# Patient Record
Sex: Female | Born: 1960 | Race: White | Hispanic: No | Marital: Married | Smoking: Current some day smoker
Health system: Southern US, Community
[De-identification: ages and names within clinical notes are randomized; demographics above are authoritative.]

## PROBLEM LIST (undated history)

## (undated) DIAGNOSIS — J9811 Atelectasis: Secondary | ICD-10-CM

## (undated) DIAGNOSIS — J449 Chronic obstructive pulmonary disease, unspecified: Secondary | ICD-10-CM

## (undated) DIAGNOSIS — J45909 Unspecified asthma, uncomplicated: Secondary | ICD-10-CM

## (undated) HISTORY — PX: CHEST TUBE INSERTION: SHX231

## (undated) HISTORY — PX: RECTAL SURGERY: SHX760

---

## 2016-10-25 ENCOUNTER — Emergency Department
Admission: EM | Admit: 2016-10-25 | Discharge: 2016-10-25 | Disposition: A | Discharge status: Home / Self Care | Payer: Medicare Other | Attending: Emergency Medicine | Admitting: Emergency Medicine

## 2016-10-25 ENCOUNTER — Emergency Department: Payer: Medicare Other

## 2016-10-25 DIAGNOSIS — R109 Unspecified abdominal pain: Secondary | ICD-10-CM | POA: Insufficient documentation

## 2016-10-25 DIAGNOSIS — R0602 Shortness of breath: Secondary | ICD-10-CM | POA: Diagnosis not present

## 2016-10-25 DIAGNOSIS — Z79899 Other long term (current) drug therapy: Secondary | ICD-10-CM | POA: Diagnosis not present

## 2016-10-25 LAB — COMPREHENSIVE METABOLIC PANEL
ALBUMIN: 4 g/dL (ref 3.5–5.0)
ALK PHOS: 81 U/L (ref 38–126)
ALT: 12 U/L — ABNORMAL LOW (ref 14–54)
ANION GAP: 8 (ref 5–15)
AST: 19 U/L (ref 15–41)
BUN: 13 mg/dL (ref 6–20)
CALCIUM: 9.2 mg/dL (ref 8.9–10.3)
CHLORIDE: 102 mmol/L (ref 101–111)
CO2: 28 mmol/L (ref 22–32)
Creatinine, Ser: 0.45 mg/dL (ref 0.44–1.00)
GFR calc Af Amer: >60 mL/min (ref >60)
GFR calc non Af Amer: >60 mL/min (ref >60)
GLUCOSE: 118 mg/dL — AB (ref 65–99)
Potassium: 3.9 mmol/L (ref 3.5–5.1)
SODIUM: 138 mmol/L (ref 135–145)
Total Bilirubin: 0.7 mg/dL (ref 0.3–1.2)
Total Protein: 7.3 g/dL (ref 6.5–8.1)

## 2016-10-25 LAB — URINALYSIS, COMPLETE (UACMP) WITH MICROSCOPIC
BACTERIA UA: NONE SEEN
BILIRUBIN URINE: NEGATIVE
Glucose, UA: NEGATIVE mg/dL
KETONES UR: 20 mg/dL — AB
Nitrite: NEGATIVE
Protein, ur: 30 mg/dL — AB
Specific Gravity, Urine: 1.018 (ref 1.005–1.030)
pH: 5 (ref 5.0–8.0)

## 2016-10-25 LAB — CBC WITH DIFFERENTIAL/PLATELET
BASOS PCT: 0 %
Basophils Absolute: 0 10*3/uL (ref 0–0.1)
EOS ABS: 0.2 10*3/uL (ref 0–0.7)
EOS PCT: 2 %
HCT: 42 % (ref 35.0–47.0)
HEMOGLOBIN: 14.4 g/dL (ref 12.0–16.0)
Lymphocytes Relative: 6 %
Lymphs Abs: 0.6 10*3/uL — ABNORMAL LOW (ref 1.0–3.6)
MCH: 32.2 pg (ref 26.0–34.0)
MCHC: 34.2 g/dL (ref 32.0–36.0)
MCV: 94.2 fL (ref 80.0–100.0)
Monocytes Absolute: 0.6 10*3/uL (ref 0.2–0.9)
Monocytes Relative: 6 %
NEUTROS PCT: 86 %
Neutro Abs: 9.8 10*3/uL — ABNORMAL HIGH (ref 1.4–6.5)
PLATELETS: 292 10*3/uL (ref 150–440)
RBC: 4.46 MIL/uL (ref 3.80–5.20)
RDW: 13.3 % (ref 11.5–14.5)
WBC: 11.2 10*3/uL — AB (ref 3.6–11.0)

## 2016-10-25 LAB — TROPONIN I

## 2016-10-25 LAB — LIPASE, BLOOD: Lipase: 12 U/L (ref 11–51)

## 2016-10-25 MED ORDER — DICLOFENAC SODIUM 3 % TD GEL: 1.0000 "application " | Freq: Two times a day (BID) | TRANSDERMAL | 0 refills | Status: DC | PRN

## 2016-10-25 MED ORDER — MORPHINE SULFATE (PF) 4 MG/ML IV SOLN
4.0000 mg | Freq: Once | INTRAVENOUS | Status: AC
  Administered 2016-10-25: 4 mg via INTRAVENOUS
  Filled 2016-10-25: qty 1

## 2016-10-25 MED ORDER — FLUCONAZOLE 100 MG PO TABS
150.0000 mg | ORAL_TABLET | Freq: Once | ORAL | Status: AC
  Administered 2016-10-25: 150 mg via ORAL
  Filled 2016-10-25: qty 1

## 2016-10-25 MED ORDER — NITROFURANTOIN MONOHYD MACRO 100 MG PO CAPS: 100.0000 mg | ORAL_CAPSULE | Freq: Two times a day (BID) | ORAL | 0 refills | Status: AC

## 2016-10-25 MED ORDER — ONDANSETRON HCL 4 MG/2ML IJ SOLN
4.0000 mg | Freq: Once | INTRAMUSCULAR | Status: AC
  Administered 2016-10-25: 4 mg via INTRAVENOUS
  Filled 2016-10-25: qty 2

## 2016-10-25 NOTE — ED Provider Notes (Signed)
[facility] Emergency Department Provider Note   ____________________________________________   First MD Initiated Contact with Patient 10/25/16 [phone]     (approximate)  I have reviewed the triage vital signs and the nursing notes.   HISTORY  Chief Complaint Back Pain and Shortness of Breath   HPI Renee Cochran is a 55 y.o. female with a history of COPD on 2-1/2 L of baseline oxygen who is presenting with right-sided flank and back pain that is been ongoing for the past day. The patient denies any injury or lifting or bending that started the pain. Says the pain is sharp and worsens with movement. Says that she also feels short of breath but feels that this is from the pain. Says that she did have a similar presentation previously that was diagnosed as pneumonia. Denies any burning with urination or blood in the urine. No history of kidney stones.   No past medical history on file.  There are no active problems to display for this patient.   No past surgical history on file.  Prior to Admission medications   Medication Sig Start Date End Date Taking? Authorizing Provider  Diclofenac Sodium 3 % GEL Place 1 application onto the skin every 12 (twelve) hours as needed (pain). 10/25/16   Myrna Blazer, MD    Allergies Penicillins and Sulfa antibiotics  No family history on file.  Social History Social History  Substance Use Topics  . Smoking status: Not on file  . Smokeless tobacco: Not on file  . Alcohol use Not on file    Review of Systems Constitutional: No fever/chills Eyes: No visual changes. ENT: No sore throat. Cardiovascular: Denies chest pain. Respiratory: As above. Gastrointestinal: No abdominal pain.  No nausea, no vomiting.  No diarrhea.  No constipation. Genitourinary: Negative for dysuria. Musculoskeletal: As above Skin: Negative for rash. Neurological: Negative for headaches, focal weakness or numbness.  10-point  ROS otherwise negative.  ____________________________________________   PHYSICAL EXAM:  VITAL SIGNS: ED Triage Vitals  Enc Vitals Group     BP 10/25/16 0526 (!) 150/67     Pulse Rate 10/25/16 0515 (!) 111     Resp 10/25/16 0526 (!) 22     Temp 10/25/16 0523 98.5 F (36.9 C)     Temp Source 10/25/16 0523 Oral     SpO2 10/25/16 0515 93 %     Weight 10/25/16 0528 140 lb (63.5 kg)     Height 10/25/16 0528 5\' 3"  (1.6 m)     Head Circumference --      Peak Flow --      Pain Score 10/25/16 0526 4     Pain Loc --      Pain Edu? --      Excl. in GC? --     Constitutional: Alert and oriented. Well appearing and in no acute distress. Eyes: Conjunctivae are normal. PERRL. EOMI. Head: Atraumatic. Nose: No congestion/rhinnorhea. Mouth/Throat: Mucous membranes are moist.   Neck: No stridor.   Cardiovascular: Tachycardic, regular rhythm. Grossly normal heart sounds.  Good peripheral circulation. Respiratory: Tachypneic but without respiratory distress.  No retractions. Lungs CTAB. Gastrointestinal: Soft and nontender. No distention. No CVA tenderness. Musculoskeletal: No lower extremity tenderness nor edema.  No joint effusions. Neurologic:  Normal speech and language. No gross focal neurologic deficits are appreciated.  Skin:  Skin is warm, dry and intact. No rash noted. Psychiatric: Mood and affect are normal. Speech and behavior are normal.  ____________________________________________   LABS (all  labs ordered are listed, but only abnormal results are displayed)  Labs Reviewed  CBC WITH DIFFERENTIAL/PLATELET - Abnormal; Notable for the following:       Result Value   WBC 11.2 (*)    Neutro Abs 9.8 (*)    Lymphs Abs 0.6 (*)    All other components within normal limits  COMPREHENSIVE METABOLIC PANEL - Abnormal; Notable for the following:    Glucose, Bld 118 (*)    ALT 12 (*)    All other components within normal limits  LIPASE, BLOOD  TROPONIN I  URINALYSIS, COMPLETE  (UACMP) WITH MICROSCOPIC   ____________________________________________  EKG  ED ECG REPORT I, Arelia LongestSchaevitz,  Arelyn Gauer M, the attending physician, personally viewed and interpreted this ECG.   Date: 10/25/2016  EKG Time: 0534  Rate: 114  Rhythm: sinus tachycardia  Axis: Normal  Intervals:none  ST&T Change: No ST segment elevation or depression. No abnormal T-wave inversion. EKG machine reads minimal ST depression. Likely related to baseline wander.  ____________________________________________  RADIOLOGY  CT RENAL STONE STUDY (Final result)  Result time 10/25/16 06:36:45  Final result by Elgie CollardArash Radparvar, MD (10/25/16 06:36:45)           Narrative:   CLINICAL DATA: 55 year old female with right flank pain.  EXAM: CT ABDOMEN AND PELVIS WITHOUT CONTRAST  TECHNIQUE: Multidetector CT imaging of the abdomen and pelvis was performed following the standard protocol without IV contrast.  COMPARISON: None.  FINDINGS: Evaluation of this exam is limited in the absence of intravenous contrast as well as due to respiratory motion artifact.  Lower chest: Mild bibasilar bronchiectatic changes. The visualized lung bases are otherwise clear.  No intra-abdominal free air or free fluid.  Hepatobiliary: The liver is unremarkable. No intrahepatic biliary ductal dilatation. A 2 mm calcific focus (Series 2, image 19 and coronal image 60) may represent focal calcification of the hepatic capsule versus a small gallstone. No pericholecystic fluid.  Pancreas: Unremarkable. No pancreatic ductal dilatation or surrounding inflammatory changes.  Spleen: Normal in size without focal abnormality.  Adrenals/Urinary Tract: Adrenal glands are unremarkable. Kidneys are normal, without renal calculi, focal lesion, or hydronephrosis. Bladder is unremarkable.  Stomach/Bowel: There is moderate stool throughout the colon. There is no bowel obstruction or active inflammation. Normal  appendix.  Vascular/Lymphatic: Moderate aortoiliac atherosclerotic disease. The abdominal aorta and IVC are grossly unremarkable on this noncontrast study. No portal venous gas identified. There is no adenopathy.  Reproductive: The uterus and ovaries are grossly unremarkable.  Other: There is diastases of anterior abdominal wall musculature in the midline with a small fat containing umbilical hernia. No fluid collection.  Musculoskeletal: Osteopenia with mild degenerative changes of the spine. No acute fracture.  IMPRESSION: No hydronephrosis or nephrolithiasis.  No bowel obstruction or active inflammation. Normal appendix.  Probable small gallstone. No pericholecystic fluid or evidence of acute cholecystitis.   Electronically Signed By: Elgie CollardArash Radparvar M.D. On: 10/25/2016 06:36            DG Chest 1 View (Final result)  Result time 10/25/16 06:28:35  Final result by Awilda Metroourtnay Bloomer, MD (10/25/16 [id number])           Narrative:   CLINICAL DATA: RIGHT flank pain. History of COPD.  EXAM: CHEST 1 VIEW  COMPARISON: None available for comparison at time of study interpretation.  FINDINGS: Cardiomediastinal silhouette is normal. Bandlike density RIGHT lung base, strandy densities LEFT lung base. Increased lung volumes. No pleural effusion. No focal consolidation. No pneumothorax. Old RIGHT rib fractures. Osteopenia.Mid thoracic  suspected compression fracture.  IMPRESSION: COPD, RIGHT greater than LEFT atelectasis/scarring.   Electronically Signed By: Awilda Metroourtnay Bloomer M.D. On: 10/25/2016 06:28            ____________________________________________   PROCEDURES  Procedure(s) performed:   Procedures  Critical Care performed:   ____________________________________________   INITIAL IMPRESSION / ASSESSMENT AND PLAN / ED COURSE  Pertinent labs & imaging results that were available during my care of the patient were reviewed by  me and considered in my medical decision making (see chart for details).   Clinical Course   ----------------------------------------- 7:40 AM on 10/25/2016 -----------------------------------------  Patient's pain is improved with morphine. Heart rate down to 102. Pending urine at this time. Signed out to Dr. Lenard LancePaduchowski. Likely musculoskeletal pain.    ____________________________________________   FINAL CLINICAL IMPRESSION(S) / ED DIAGNOSES  Final diagnoses:  Right flank pain      NEW MEDICATIONS STARTED DURING THIS VISIT:  New Prescriptions   DICLOFENAC SODIUM 3 % GEL    Place 1 application onto the skin every 12 (twelve) hours as needed (pain).     Note:  This document was prepared using Dragon voice recognition software and may include unintentional dictation errors.    Myrna Blazeravid Matthew Maze Corniel, MD 10/25/16 [phone]

## 2016-10-25 NOTE — ED Notes (Signed)
Pt resting. Reports breathing WNL. Pt encouraged to provide urine sample, water given x 2. Pt understands lack of sample is holding up possible discharge.

## 2016-10-25 NOTE — Discharge Instructions (Addendum)
Your urine has shown a mild urinary tract infection, possible yeast infection. Give been covered for both of these possibilities. A urine culture has been sent. Please follow-up with your primary care doctor in the next 2-3 days for recheck/reevaluation. Return to the emergency department for any worsening pain, fever, or any other symptom personally concerning to yourself.

## 2016-10-25 NOTE — ED Triage Notes (Signed)
Pt c/o mid back pain radiating around ribs x 1.5 days. Pt has COPD and wears O2 @ 2.5/L/min/[place] @ home 24/7. Pt c/o increased shortness of breath. Pt is tachycardiac w/ loud, prolonged expiration. Pt states pain reproducible w/ movement and coughing.

## 2016-10-26 LAB — URINE CULTURE: Culture: >=100000 — AB

## 2016-10-27 NOTE — Progress Notes (Signed)
ED Antimicrobial Stewardship Positive Culture Follow Up   Kennith CenterBonnie Stormes is an 55 y.o. female who presented [facility]2016 with a chief complaint of  Chief Complaint  Patient presents with  . Back Pain  . Shortness of Breath   Recent Results (from the past 720 hour(s))  Urine culture     Status: Abnormal   Collection Time: 10/25/16 11:15 AM  Result Value Ref Range Status   Specimen Description URINE, RANDOM  Final   Special Requests NONE  Final   Culture >=100,000 COLONIES/mL YEAST (A)  Final   Report Status 10/26/2016 FINAL  Final    [x]  Treated with nitrofurantoin, organism resistant to prescribed antimicrobial  Called and spoke to patient regarding urine culture results and [facility] [address].  New antibiotic prescription: fluconazole 150 mg tablet x 1   ED Provider: Dr. Margarette AsalSchaevitz   Samariah Hokenson M Jamis Kryder, PharmD Clinical Pharmacist 10/27/2016, 2:07 PM

## 2017-05-03 ENCOUNTER — Inpatient Hospital Stay
Admission: EM | Admit: 2017-05-03 | Discharge: 2017-05-06 | DRG: 199 | Disposition: A | Discharge status: Home / Self Care | Payer: Medicare Other | Attending: Internal Medicine | Admitting: Internal Medicine

## 2017-05-03 ENCOUNTER — Emergency Department: Payer: Medicare Other

## 2017-05-03 DIAGNOSIS — Z882 Allergy status to sulfonamides status: Secondary | ICD-10-CM | POA: Diagnosis not present

## 2017-05-03 DIAGNOSIS — Z79899 Other long term (current) drug therapy: Secondary | ICD-10-CM

## 2017-05-03 DIAGNOSIS — J9811 Atelectasis: Secondary | ICD-10-CM | POA: Diagnosis present

## 2017-05-03 DIAGNOSIS — J9601 Acute respiratory failure with hypoxia: Secondary | ICD-10-CM | POA: Diagnosis not present

## 2017-05-03 DIAGNOSIS — J441 Chronic obstructive pulmonary disease with (acute) exacerbation: Secondary | ICD-10-CM | POA: Diagnosis not present

## 2017-05-03 DIAGNOSIS — J9621 Acute and chronic respiratory failure with hypoxia: Secondary | ICD-10-CM | POA: Diagnosis present

## 2017-05-03 DIAGNOSIS — Z9981 Dependence on supplemental oxygen: Secondary | ICD-10-CM

## 2017-05-03 DIAGNOSIS — J9312 Secondary spontaneous pneumothorax: Secondary | ICD-10-CM | POA: Diagnosis not present

## 2017-05-03 DIAGNOSIS — J9383 Other pneumothorax: Secondary | ICD-10-CM

## 2017-05-03 DIAGNOSIS — Z880 Allergy status to penicillin: Secondary | ICD-10-CM | POA: Diagnosis not present

## 2017-05-03 DIAGNOSIS — J440 Chronic obstructive pulmonary disease with acute lower respiratory infection: Secondary | ICD-10-CM | POA: Diagnosis present

## 2017-05-03 DIAGNOSIS — J9690 Respiratory failure, unspecified, unspecified whether with hypoxia or hypercapnia: Secondary | ICD-10-CM | POA: Diagnosis present

## 2017-05-03 DIAGNOSIS — J209 Acute bronchitis, unspecified: Secondary | ICD-10-CM | POA: Diagnosis present

## 2017-05-03 DIAGNOSIS — J930 Spontaneous tension pneumothorax: Secondary | ICD-10-CM | POA: Diagnosis present

## 2017-05-03 DIAGNOSIS — Z7951 Long term (current) use of inhaled steroids: Secondary | ICD-10-CM | POA: Diagnosis not present

## 2017-05-03 DIAGNOSIS — J939 Pneumothorax, unspecified: Secondary | ICD-10-CM | POA: Diagnosis present

## 2017-05-03 DIAGNOSIS — R0602 Shortness of breath: Secondary | ICD-10-CM | POA: Diagnosis present

## 2017-05-03 DIAGNOSIS — F17210 Nicotine dependence, cigarettes, uncomplicated: Secondary | ICD-10-CM | POA: Diagnosis present

## 2017-05-03 HISTORY — DX: Chronic obstructive pulmonary disease, unspecified: J44.9

## 2017-05-03 HISTORY — DX: Unspecified asthma, uncomplicated: J45.909

## 2017-05-03 LAB — BASIC METABOLIC PANEL
ANION GAP: 8 (ref 5–15)
Anion gap: 8 (ref 5–15)
BUN: 14 mg/dL (ref 6–20)
BUN: 15 mg/dL (ref 6–20)
CALCIUM: 8.7 mg/dL — AB (ref 8.9–10.3)
CO2: 31 mmol/L (ref 22–32)
CO2: 32 mmol/L (ref 22–32)
Calcium: 9 mg/dL (ref 8.9–10.3)
Chloride: 100 mmol/L — ABNORMAL LOW (ref 101–111)
Chloride: 100 mmol/L — ABNORMAL LOW (ref 101–111)
Creatinine, Ser: 0.67 mg/dL (ref 0.44–1.00)
Creatinine, Ser: 0.65 mg/dL (ref 0.44–1.00)
GFR calc Af Amer: >60 mL/min (ref >60)
GLUCOSE: 191 mg/dL — AB (ref 65–99)
GLUCOSE: 207 mg/dL — AB (ref 65–99)
POTASSIUM: 3.6 mmol/L (ref 3.5–5.1)
POTASSIUM: 3.8 mmol/L (ref 3.5–5.1)
SODIUM: 139 mmol/L (ref 135–145)
Sodium: 140 mmol/L (ref 135–145)

## 2017-05-03 LAB — CBC
HCT: 41.5 % (ref 35.0–47.0)
Hemoglobin: 13.7 g/dL (ref 12.0–16.0)
MCH: 32.1 pg (ref 26.0–34.0)
MCHC: 33 g/dL (ref 32.0–36.0)
MCV: 97.5 fL (ref 80.0–100.0)
PLATELETS: 282 10*3/uL (ref 150–440)
RBC: 4.26 MIL/uL (ref 3.80–5.20)
RDW: 14.3 % (ref 11.5–14.5)
WBC: 14.8 10*3/uL — AB (ref 3.6–11.0)

## 2017-05-03 LAB — PHOSPHORUS: PHOSPHORUS: 3.2 mg/dL (ref 2.5–4.6)

## 2017-05-03 LAB — GLUCOSE, CAPILLARY
GLUCOSE-CAPILLARY: 195 mg/dL — AB (ref 65–99)
GLUCOSE-CAPILLARY: 178 mg/dL — AB (ref 65–99)
GLUCOSE-CAPILLARY: 172 mg/dL — AB (ref 65–99)
GLUCOSE-CAPILLARY: 152 mg/dL — AB (ref 65–99)
GLUCOSE-CAPILLARY: 175 mg/dL — AB (ref 65–99)

## 2017-05-03 LAB — TROPONIN I
Troponin I: <0.03 ng/mL (ref <0.03)
Troponin I: <0.03 ng/mL (ref <0.03)

## 2017-05-03 LAB — CBC WITH DIFFERENTIAL/PLATELET
Basophils Absolute: 0 10*3/uL (ref 0–0.1)
Basophils Relative: 0 %
EOS PCT: 1 %
Eosinophils Absolute: 0.2 10*3/uL (ref 0–0.7)
HEMATOCRIT: 44 % (ref 35.0–47.0)
Hemoglobin: 14.5 g/dL (ref 12.0–16.0)
LYMPHS ABS: 1.9 10*3/uL (ref 1.0–3.6)
LYMPHS PCT: 9 %
MCH: 31.3 pg (ref 26.0–34.0)
MCHC: 32.9 g/dL (ref 32.0–36.0)
MCV: 95 fL (ref 80.0–100.0)
MONO ABS: 1.1 10*3/uL — AB (ref 0.2–0.9)
Monocytes Relative: 5 %
Neutro Abs: 18.4 10*3/uL — ABNORMAL HIGH (ref 1.4–6.5)
Neutrophils Relative %: 85 %
PLATELETS: 404 10*3/uL (ref 150–440)
RBC: 4.63 MIL/uL (ref 3.80–5.20)
RDW: 14.3 % (ref 11.5–14.5)
WBC: 21.7 10*3/uL — ABNORMAL HIGH (ref 3.6–11.0)

## 2017-05-03 LAB — PROCALCITONIN

## 2017-05-03 LAB — MRSA PCR SCREENING: MRSA by PCR: NEGATIVE

## 2017-05-03 LAB — MAGNESIUM: MAGNESIUM: 2.9 mg/dL — AB (ref 1.7–2.4)

## 2017-05-03 MED ORDER — MOMETASONE FURO-FORMOTEROL FUM 200-5 MCG/ACT IN AERO: 2.0000 | INHALATION_SPRAY | Freq: Two times a day (BID) | RESPIRATORY_TRACT | Status: DC

## 2017-05-03 MED ORDER — IPRATROPIUM-ALBUTEROL 0.5-2.5 (3) MG/3ML IN SOLN
3.0000 mL | RESPIRATORY_TRACT | Status: AC
  Administered 2017-05-03: 3 mL via RESPIRATORY_TRACT

## 2017-05-03 MED ORDER — GABAPENTIN 600 MG PO TABS
300.0000 mg | ORAL_TABLET | Freq: Three times a day (TID) | ORAL | Status: DC
  Filled 2017-05-03: qty 0.5

## 2017-05-03 MED ORDER — MAGNESIUM SULFATE 2 GM/50ML IV SOLN
2.0000 g | Freq: Once | INTRAVENOUS | Status: AC
  Administered 2017-05-03: 2 g via INTRAVENOUS
  Filled 2017-05-03: qty 50

## 2017-05-03 MED ORDER — BUDESONIDE 0.25 MG/2ML IN SUSP
0.2500 mg | Freq: Two times a day (BID) | RESPIRATORY_TRACT | Status: DC
  Administered 2017-05-03 – 2017-05-04 (×3): 0.25 mg via RESPIRATORY_TRACT
  Filled 2017-05-03 (×3): qty 2

## 2017-05-03 MED ORDER — ORAL CARE MOUTH RINSE
15.0000 mL | Freq: Two times a day (BID) | OROMUCOSAL | Status: DC
  Administered 2017-05-03 – 2017-05-06 (×7): 15 mL via OROMUCOSAL

## 2017-05-03 MED ORDER — SODIUM CHLORIDE 0.9% FLUSH: 3.0000 mL | INTRAVENOUS | Status: DC | PRN

## 2017-05-03 MED ORDER — ALBUTEROL SULFATE (2.5 MG/3ML) 0.083% IN NEBU
7.5000 mg | INHALATION_SOLUTION | Freq: Once | RESPIRATORY_TRACT | Status: AC
  Administered 2017-05-03: 7.5 mg via RESPIRATORY_TRACT
  Filled 2017-05-03: qty 9

## 2017-05-03 MED ORDER — LIDOCAINE HCL (PF) 1 % IJ SOLN
INTRAMUSCULAR | Status: AC
  Filled 2017-05-03: qty 10

## 2017-05-03 MED ORDER — MORPHINE SULFATE (PF) 4 MG/ML IV SOLN
INTRAVENOUS | Status: AC
  Filled 2017-05-03: qty 1

## 2017-05-03 MED ORDER — INSULIN ASPART 100 UNIT/ML ~~LOC~~ SOLN
0.0000 [IU] | Freq: Three times a day (TID) | SUBCUTANEOUS | Status: DC
  Administered 2017-05-03 – 2017-05-04 (×4): 3 [IU] via SUBCUTANEOUS
  Administered 2017-05-04: 2 [IU] via SUBCUTANEOUS
  Administered 2017-05-05: 5 [IU] via SUBCUTANEOUS
  Filled 2017-05-03 (×6): qty 1

## 2017-05-03 MED ORDER — SODIUM CHLORIDE 0.9% FLUSH
3.0000 mL | Freq: Two times a day (BID) | INTRAVENOUS | Status: DC
  Administered 2017-05-03 – 2017-05-06 (×8): 3 mL via INTRAVENOUS

## 2017-05-03 MED ORDER — MORPHINE SULFATE (PF) 2 MG/ML IV SOLN: 2.0000 mg | INTRAVENOUS | Status: DC | PRN

## 2017-05-03 MED ORDER — MORPHINE SULFATE (PF) 4 MG/ML IV SOLN
4.0000 mg | Freq: Once | INTRAVENOUS | Status: AC
  Administered 2017-05-03: 01:00:00 via INTRAVENOUS

## 2017-05-03 MED ORDER — ONDANSETRON HCL 4 MG/2ML IJ SOLN: 4.0000 mg | Freq: Four times a day (QID) | INTRAMUSCULAR | Status: DC | PRN

## 2017-05-03 MED ORDER — LORAZEPAM 2 MG/ML IJ SOLN
0.5000 mg | Freq: Once | INTRAMUSCULAR | Status: AC
  Administered 2017-05-03: 0.5 mg via INTRAVENOUS
  Filled 2017-05-03: qty 1

## 2017-05-03 MED ORDER — ALBUTEROL SULFATE (2.5 MG/3ML) 0.083% IN NEBU: 2.5000 mg | INHALATION_SOLUTION | RESPIRATORY_TRACT | Status: DC

## 2017-05-03 MED ORDER — ALBUTEROL SULFATE (2.5 MG/3ML) 0.083% IN NEBU: 2.5000 mg | INHALATION_SOLUTION | RESPIRATORY_TRACT | Status: DC | PRN

## 2017-05-03 MED ORDER — IPRATROPIUM BROMIDE 0.02 % IN SOLN
1.0000 ug | Freq: Three times a day (TID) | RESPIRATORY_TRACT | Status: DC
Start: 2017-05-03 — End: 2017-05-03

## 2017-05-03 MED ORDER — MONTELUKAST SODIUM 10 MG PO TABS
10.0000 mg | ORAL_TABLET | Freq: Every day | ORAL | Status: DC
  Administered 2017-05-03 – 2017-05-05 (×3): 10 mg via ORAL
  Filled 2017-05-03 (×3): qty 1

## 2017-05-03 MED ORDER — INSULIN ASPART 100 UNIT/ML ~~LOC~~ SOLN: 0.0000 [IU] | Freq: Every day | SUBCUTANEOUS | Status: DC

## 2017-05-03 MED ORDER — ENOXAPARIN SODIUM 40 MG/0.4ML ~~LOC~~ SOLN
40.0000 mg | SUBCUTANEOUS | Status: DC
  Administered 2017-05-03 – 2017-05-06 (×4): 40 mg via SUBCUTANEOUS
  Filled 2017-05-03 (×4): qty 0.4

## 2017-05-03 MED ORDER — SODIUM CHLORIDE 0.9 % IV SOLN: 250.0000 mL | INTRAVENOUS | Status: DC | PRN

## 2017-05-03 MED ORDER — ACETAMINOPHEN 325 MG PO TABS
650.0000 mg | ORAL_TABLET | Freq: Four times a day (QID) | ORAL | Status: AC
  Administered 2017-05-03 – 2017-05-06 (×11): 650 mg via ORAL
  Filled 2017-05-03 (×11): qty 2

## 2017-05-03 MED ORDER — IPRATROPIUM-ALBUTEROL 0.5-2.5 (3) MG/3ML IN SOLN
3.0000 mL | RESPIRATORY_TRACT | Status: DC
  Administered 2017-05-03 – 2017-05-04 (×8): 3 mL via RESPIRATORY_TRACT
  Filled 2017-05-03 (×8): qty 3

## 2017-05-03 MED ORDER — DEXTROSE 5 % IV SOLN
500.0000 mg | INTRAVENOUS | Status: DC
  Filled 2017-05-03: qty 500

## 2017-05-03 MED ORDER — METHYLPREDNISOLONE SODIUM SUCC 40 MG IJ SOLR
40.0000 mg | Freq: Every day | INTRAMUSCULAR | Status: DC
  Administered 2017-05-04 – 2017-05-06 (×3): 40 mg via INTRAVENOUS
  Filled 2017-05-03 (×3): qty 1

## 2017-05-03 MED ORDER — ALBUTEROL SULFATE HFA 108 (90 BASE) MCG/ACT IN AERS: 2.0000 | INHALATION_SPRAY | RESPIRATORY_TRACT | Status: DC | PRN

## 2017-05-03 MED ORDER — ENOXAPARIN SODIUM 40 MG/0.4ML ~~LOC~~ SOLN: 40.0000 mg | SUBCUTANEOUS | Status: DC

## 2017-05-03 MED ORDER — TIOTROPIUM BROMIDE MONOHYDRATE 18 MCG IN CAPS: 18.0000 ug | ORAL_CAPSULE | Freq: Every day | RESPIRATORY_TRACT | Status: DC

## 2017-05-03 MED ORDER — ACETAMINOPHEN 650 MG RE SUPP: 650.0000 mg | Freq: Four times a day (QID) | RECTAL | Status: DC | PRN

## 2017-05-03 MED ORDER — ONDANSETRON HCL 4 MG PO TABS: 4.0000 mg | ORAL_TABLET | Freq: Four times a day (QID) | ORAL | Status: DC | PRN

## 2017-05-03 MED ORDER — DICLOFENAC SODIUM 3 % TD GEL: 1.0000 "application " | Freq: Two times a day (BID) | TRANSDERMAL | Status: DC | PRN

## 2017-05-03 MED ORDER — DEXTROSE 5 % IV SOLN
500.0000 mg | Freq: Once | INTRAVENOUS | Status: AC
  Administered 2017-05-03: 500 mg via INTRAVENOUS
  Filled 2017-05-03: qty 500

## 2017-05-03 MED ORDER — KCL IN DEXTROSE-NACL 20-5-0.45 MEQ/L-%-% IV SOLN
INTRAVENOUS | Status: DC
  Administered 2017-05-03: 05:00:00 via INTRAVENOUS
  Filled 2017-05-03 (×3): qty 1000

## 2017-05-03 MED ORDER — ASPIRIN 81 MG PO CHEW
324.0000 mg | CHEWABLE_TABLET | ORAL | Status: AC
  Administered 2017-05-03: 324 mg via ORAL
  Filled 2017-05-03: qty 4

## 2017-05-03 MED ORDER — IPRATROPIUM-ALBUTEROL 0.5-2.5 (3) MG/3ML IN SOLN
RESPIRATORY_TRACT | Status: AC
  Administered 2017-05-03: 3 mL via RESPIRATORY_TRACT
  Filled 2017-05-03: qty 3

## 2017-05-03 MED ORDER — ASPIRIN 300 MG RE SUPP: 300.0000 mg | RECTAL | Status: AC

## 2017-05-03 MED ORDER — AZITHROMYCIN 250 MG PO TABS
500.0000 mg | ORAL_TABLET | Freq: Every day | ORAL | Status: DC
  Administered 2017-05-04 – 2017-05-06 (×3): 500 mg via ORAL
  Filled 2017-05-03: qty 2
  Filled 2017-05-03: qty 1
  Filled 2017-05-03: qty 2

## 2017-05-03 MED ORDER — LIDOCAINE HCL (PF) 1 % IJ SOLN: 20.0000 mL | Freq: Once | INTRAMUSCULAR | Status: DC

## 2017-05-03 MED ORDER — ACETAMINOPHEN 325 MG PO TABS
650.0000 mg | ORAL_TABLET | Freq: Four times a day (QID) | ORAL | Status: DC | PRN
  Administered 2017-05-03: 650 mg via ORAL
  Filled 2017-05-03: qty 2

## 2017-05-03 MED ORDER — DICLOFENAC SODIUM 1 % TD GEL
2.0000 g | Freq: Two times a day (BID) | TRANSDERMAL | Status: DC | PRN
  Filled 2017-05-03: qty 100

## 2017-05-03 MED ORDER — SENNOSIDES-DOCUSATE SODIUM 8.6-50 MG PO TABS: 1.0000 | ORAL_TABLET | Freq: Every evening | ORAL | Status: DC | PRN

## 2017-05-03 MED ORDER — GABAPENTIN 300 MG PO CAPS
300.0000 mg | ORAL_CAPSULE | Freq: Three times a day (TID) | ORAL | Status: DC
  Administered 2017-05-03 – 2017-05-06 (×9): 300 mg via ORAL
  Filled 2017-05-03 (×9): qty 1

## 2017-05-03 MED ORDER — MORPHINE SULFATE (PF) 2 MG/ML IV SOLN
2.0000 mg | INTRAVENOUS | Status: DC | PRN
Start: 2017-05-03 — End: 2017-05-06
  Administered 2017-05-03 – 2017-05-05 (×6): 2 mg via INTRAVENOUS
  Filled 2017-05-03 (×6): qty 1

## 2017-05-03 MED ORDER — METHYLPREDNISOLONE SODIUM SUCC 40 MG IJ SOLR
40.0000 mg | Freq: Two times a day (BID) | INTRAMUSCULAR | Status: DC
  Administered 2017-05-03: 40 mg via INTRAVENOUS
  Filled 2017-05-03: qty 1

## 2017-05-03 NOTE — Consult Note (Signed)
Name: Renee Cochran MRN: [id number] DOB: May 04, 1961    ADMISSION DATE:  05/03/2017 CONSULTATION DATE: 05/03/2017  REFERRING MD : Dr. Tonita CongWoodham     CHIEF COMPLAINT: Shortness of Breath   BRIEF PATIENT DESCRIPTION:  56 yo female admitted 06/26 with acute on chronic hypoxic respiratory failure secondary to AECOPD was placed on continuous Bipap and developed a moderate left sided pneumothorax requiring chest tube placement   SIGNIFICANT EVENTS  06/26-Pt admitted to ICU   STUDIES:  None   HISTORY OF PRESENT ILLNESS:   This is a 56 yo female with a PMH of COPD, Chronic O2 at 2-3L, Current Everyday Smoker, and Asthma. [facility] 06/26 with c/o shortness of breath and productive cough onset of symptoms 2 weeks prior to presentation to the ER.  Per ER notes she also reported bilateral sharp thoracic pain worse during coughing.  Due to symptoms she notified EMS and upon EMS arrival pts O2 sats were in the low 80's on her home O2.  Therefore, she was given 2 albuterol nebs and 125 mg iv solumedrol en route to the ER.  In the ER she was given albuterol neb, iv morphine, iv magnesium, and placed on continuous Bipap. However, she remained hypoxic CXR revealed a moderate left sided pneumothorax requiring left sided chest tube placement by surgical team.  She was subsequently admitted by the  surgical team to ICU for further workup and treatment PCCM consulted.    PAST MEDICAL HISTORY :   has a past medical history of Asthma and COPD (chronic obstructive pulmonary disease) (HCC).  has no past surgical history on file. Prior to Admission medications   Medication Sig Start Date End Date Taking? Authorizing Provider  albuterol (PROVENTIL HFA;VENTOLIN HFA) 108 (90 Base) MCG/ACT inhaler Inhale 2 puffs into the lungs every 4 (four) hours as needed. 04/16/16   [provider]  albuterol (PROVENTIL) (2.5 MG/3ML) 0.083% nebulizer solution Take 2.5 mg by nebulization every 4 (four) hours as  needed for wheezing or shortness of breath.    [provider]  Diclofenac Sodium 3 % GEL Place 1 application onto the skin every 12 (twelve) hours as needed (pain). 10/25/16   Myrna BlazerSchaevitz, David Matthew, MD  Fluticasone-Salmeterol (ADVAIR DISKUS) 250-50 MCG/DOSE AEPB Inhale 1 puff into the lungs 2 (two) times daily. 04/16/16   [provider]  ipratropium (ATROVENT) 0.02 % nebulizer solution Take 1 mcg by nebulization 3 (three) times daily.  10/14/16   [provider]  montelukast (SINGULAIR) 10 MG tablet Take 1 tablet by mouth at bedtime. 09/27/16   [provider]  tiotropium (SPIRIVA) 18 MCG inhalation capsule Place 18 mcg into inhaler and inhale daily.    [provider]   Allergies  Allergen Reactions  . Penicillins Anaphylaxis    Has patient had a PCN reaction causing immediate rash, facial/tongue/throat swelling, SOB or lightheadedness with hypotension: yes Has patient had a PCN reaction causing severe rash involving mucus membranes or skin necrosis: yes Has patient had a PCN reaction that required hospitalization yes Has patient had a PCN reaction occurring within the last 10 years: no If all of the above answers are "NO", then may proceed with Cephalosporin use.   . Sulfa Antibiotics Anaphylaxis    FAMILY HISTORY:  family history is not on file. SOCIAL HISTORY:  reports that she has been smoking Cigarettes.  She has been smoking about 0.50 packs per day. She has never used smokeless tobacco.  REVIEW OF SYSTEMS: Positives in  BOLD  Constitutional: Negative for fever, chills, weight loss, malaise/fatigue and diaphoresis.  HENT: Negative for hearing loss, ear pain, nosebleeds, congestion, sore throat, neck pain, tinnitus and ear discharge.   Eyes: Negative for blurred vision, double vision, photophobia, pain, discharge and redness.  Respiratory: cough, hemoptysis, sputum production, shortness of breath, wheezing and stridor.   Cardiovascular:  Negative for chest pain, palpitations, orthopnea, claudication, leg swelling and PND.  Gastrointestinal: Negative for heartburn, nausea, vomiting, abdominal pain, diarrhea, constipation, blood in stool and melena.  Genitourinary: Negative for dysuria, urgency, frequency, hematuria and flank pain.  Musculoskeletal: Negative for myalgias, back pain, joint pain and falls.  Skin: Negative for itching and rash.  Neurological: Negative for dizziness, tingling, tremors, sensory change, speech change, focal weakness, seizures, loss of consciousness, weakness and headaches.  Endo/Heme/Allergies: Negative for environmental allergies and polydipsia. Does not bruise/bleed easily.  SUBJECTIVE:  Pt severely short of breath.  VITAL SIGNS: Temp:  [97.4 F (36.3 C)] 97.4 F (36.3 C) (06/26 0007) Pulse Rate:  [104-128] 126 (06/26 0115) Resp:  [23-29] 27 (06/26 0115) BP: (158-169)/(79-100) 169/85 (06/26 0115) SpO2:  [82 %-95 %] 84 % (06/26 0115) FiO2 (%):  [30 %] 30 % (06/26 0017) Weight:  [61.2 kg (135 lb)] 61.2 kg (135 lb) (06/26 0008)  PHYSICAL EXAMINATION: General: acutely ill appearing Caucasian female in acute respiratory distress Neuro: alert and oriented, follows commands HEENT: supple, mild JVD Cardiovascular: sinus tach, s1s2, no M/R/G Lungs: diffusely diminished throughout, tachypneic, and labored, left sided chest tube dressing dry and intact no subcutaneous emphysema present  Abdomen: hypoactive BS x4, soft, non tender, non distended Musculoskeletal: normal bulk and tone Skin: intact no rashes or lesions   Recent Labs Lab 05/03/17 0013  NA 140  K 3.8  CL 100*  CO2 32  BUN 14  CREATININE 0.67  GLUCOSE 191*    Recent Labs Lab 05/03/17 0013  HGB 14.5  HCT 44.0  WBC 21.7*  PLT 404   Dg Chest 1 View  Result Date: 05/03/2017 CLINICAL DATA:  56 y/o  F; short of breath. EXAM: CHEST 1 VIEW COMPARISON:  10/25/2016 chest radiograph FINDINGS: Stable normal cardiac silhouette.  Moderate left-sided pneumothorax. No significant shift of the mediastinum. Stable linear opacities at lung bases compatible with scarring. No acute osseous abnormality is evident. IMPRESSION: Moderate left-sided pneumothorax. No significant shift of mediastinum. These results were called by telephone at the time of interpretation on 05/03/2017 at 12:51 am to Dr. Gladstone Pih , who verbally acknowledged these results. Electronically Signed   By: Mitzi Hansen M.D.   On: 05/03/2017 00:52    ASSESSMENT / PLAN: Acute on chronic hypoxic respiratory failure secondary to AECOPD  S/P left sided chest tube placement secondary to left sided moderate pneumothorax (06/26) Hx: Current Everyday Smoker, Chronic Home O2 at 2-3L, and Asthma P: Continue chest tube at 20 cm of water suction Surgery consulted appreciate input  Supplemental O2 for dyspnea and/or hypoxia  Maintain O2 sats 88% to 92% Nebulized steroids  Scheduled and prn bronchodilator therapy IV steroids  Repeat cxr in am  Trend WBC and monitor fever curve Trend PCT  Continue azithromycin  Lovenox for VTE prophylaxis Trend CBC Monitor for s/sx of bleeding Transfuse for hgb <7 HIGH RISK FOR INTUBATION  She will need to establish care with an outpatient pulmonologist at discharge and will need outpatient PFT's   Sonda Rumble, AGNP  Pulmonary/Critical Care Pager [phone] (please enter 7 digits) PCCM Consult Pager [phone] (please enter 7 digits)

## 2017-05-03 NOTE — H&P (Signed)
[facility] [place] [facility]   PATIENT NAME: Renee Cochran    MR#:  [id number]  DATE OF BIRTH:  [birth year]  DATE OF ADMISSION:  05/03/2017  PRIMARY CARE PHYSICIAN: Cain SieveKlipstein, Christopher, MD   REQUESTING/REFERRING PHYSICIAN:   CHIEF COMPLAINT:   Chief Complaint  Patient presents with  . Respiratory Distress    HISTORY OF PRESENT ILLNESS: Renee Cochran  is a 56 y.o. female with a known history of COPD on home oxygen, bronchial asthma presented to the emergency room with difficulty breathing. She had increased shortness of breath yesterday evening. She had some cough and congestion for the last couple of days and uses home oxygen. Yesterday she had more difficulty breathing and she came to the emergency room she was evaluated with chest x-ray which showed left sided moderate pneumothorax. Patient was initially put on BiPAP and later on she received a chest tube on the left side which was put in the emergency room. She was weaned off the BiPAP to oxygen via nasal cannula at 4 L in the emergency room. She received IV morphine in the emergency room for pain. She also received IV Zithromax antibiotic for bronchitis. No fever and chills. No history of any recent travel. He is an active smoker. Had repeat chest x-ray was done after chest tube was placed which showed expansion of the lung and reduction in the left sided pneumothorax. Hospitalist service was consulted for further care of the patient.  PAST MEDICAL HISTORY:   Past Medical History:  Diagnosis Date  . Asthma   . COPD (chronic obstructive pulmonary disease) (HCC)     PAST SURGICAL HISTORY: Past Surgical History:  Procedure Laterality Date  . CESAREAN SECTION      SOCIAL HISTORY:  Social History  Substance Use Topics  . Smoking status: Current Some Day Smoker    Packs/day: 0.50    Types: Cigarettes  . Smokeless tobacco: Never Used  . Alcohol use No    FAMILY HISTORY:  Family History   Problem Relation Age of Onset  . Colon cancer Father   . Colon cancer Paternal Grandfather   . Diabetes Neg Hx   . Hypertension Neg Hx     DRUG ALLERGIES:  Allergies  Allergen Reactions  . Penicillins Anaphylaxis    Has patient had a PCN reaction causing immediate rash, facial/tongue/throat swelling, SOB or lightheadedness with hypotension: yes Has patient had a PCN reaction causing severe rash involving mucus membranes or skin necrosis: yes Has patient had a PCN reaction that required hospitalization yes Has patient had a PCN reaction occurring within the last 10 years: no If all of the above answers are "NO", then may proceed with Cephalosporin use.   . Sulfa Antibiotics Anaphylaxis    REVIEW OF SYSTEMS:   CONSTITUTIONAL: No fever, fatigue or weakness.  EYES: No blurred or double vision.  EARS, NOSE, AND THROAT: No tinnitus or ear pain.  RESPIRATORY: Has cough, shortness of breath,  No wheezing or hemoptysis.  CARDIOVASCULAR: No chest pain, orthopnea, edema.  GASTROINTESTINAL: No nausea, vomiting, diarrhea or abdominal pain.  GENITOURINARY: No dysuria, hematuria.  ENDOCRINE: No polyuria, nocturia,  HEMATOLOGY: No anemia, easy bruising or bleeding SKIN: No rash or lesion. MUSCULOSKELETAL: No joint pain or arthritis.   NEUROLOGIC: No tingling, numbness, weakness.  PSYCHIATRY: No anxiety or depression.   MEDICATIONS AT HOME:  Prior to Admission medications   Medication Sig Start Date End Date Taking? Authorizing Provider  albuterol (PROVENTIL HFA;VENTOLIN HFA) 108 (90  Base) MCG/ACT inhaler Inhale 2 puffs into the lungs every 4 (four) hours as needed. 04/16/16   [provider]  albuterol (PROVENTIL) (2.5 MG/3ML) 0.083% nebulizer solution Take 2.5 mg by nebulization every 4 (four) hours as needed for wheezing or shortness of breath.    [provider]  Diclofenac Sodium 3 % GEL Place 1 application onto the skin every 12 (twelve) hours as needed (pain).  10/25/16   Myrna Blazer, MD  Fluticasone-Salmeterol (ADVAIR DISKUS) 250-50 MCG/DOSE AEPB Inhale 1 puff into the lungs 2 (two) times daily. 04/16/16   [provider]  ipratropium (ATROVENT) 0.02 % nebulizer solution Take 1 mcg by nebulization 3 (three) times daily.  10/14/16   [provider]  montelukast (SINGULAIR) 10 MG tablet Take 1 tablet by mouth at bedtime. 09/27/16   [provider]  tiotropium (SPIRIVA) 18 MCG inhalation capsule Place 18 mcg into inhaler and inhale daily.    [provider]      PHYSICAL EXAMINATION:   VITAL SIGNS: Blood pressure 126/74, pulse (!) 119, temperature 97.4 F (36.3 C), temperature source Axillary, resp. rate 19, height 5\' 2"  (1.575 m), weight 61.2 kg (135 lb), SpO2 95 %.  GENERAL:  56 y.o.-year-old patient lying in the bed with no acute distress.  EYES: Pupils equal, round, reactive to light and accommodation. No scleral icterus. Extraocular muscles intact.  HEENT: Head atraumatic, normocephalic. Oropharynx and nasopharynx clear.  NECK:  Supple, no jugular venous distention. No thyroid enlargement, no tenderness.  LUNGS: Improved breath sounds left lung,adequate air flow right lung, scattered rhonchi heard.  Left sided chest tube noted CARDIOVASCULAR: S1, S2 normal. No murmurs, rubs, or gallops.  ABDOMEN: Soft, nontender, nondistended. Bowel sounds present. No organomegaly or mass.  EXTREMITIES: No pedal edema, cyanosis, or clubbing.  NEUROLOGIC: Cranial nerves II through XII are intact. Muscle strength 5/5 in all extremities. Sensation intact. Gait not checked.  PSYCHIATRIC: The patient is alert and oriented x 3.  SKIN: No obvious rash, lesion, or ulcer.   LABORATORY PANEL:   CBC  Recent Labs Lab 05/03/17 0013  WBC 21.7*  HGB 14.5  HCT 44.0  PLT 404  MCV 95.0  MCH 31.3  MCHC 32.9  RDW 14.3  LYMPHSABS 1.9  MONOABS 1.1*  EOSABS 0.2  BASOSABS 0.0    ------------------------------------------------------------------------------------------------------------------  Chemistries   Recent Labs Lab 05/03/17 0013  NA 140  K 3.8  CL 100*  CO2 32  GLUCOSE 191*  BUN 14  CREATININE 0.67  CALCIUM 9.0   ------------------------------------------------------------------------------------------------------------------ estimated creatinine clearance is 65.9 mL/min (by C-G formula based on SCr of 0.67 mg/dL). ------------------------------------------------------------------------------------------------------------------ No results for input(s): TSH, T4TOTAL, T3FREE, THYROIDAB in the last 72 hours.  Invalid input(s): FREET3   Coagulation profile No results for input(s): INR, PROTIME in the last 168 hours. ------------------------------------------------------------------------------------------------------------------- No results for input(s): DDIMER in the last 72 hours. -------------------------------------------------------------------------------------------------------------------  Cardiac Enzymes  Recent Labs Lab 05/03/17 0013  TROPONINI <0.03   ------------------------------------------------------------------------------------------------------------------ Invalid input(s): POCBNP  ---------------------------------------------------------------------------------------------------------------  Urinalysis    Component Value Date/Time   COLORURINE YELLOW (A) 10/25/2016 1105   APPEARANCEUR CLEAR (A) 10/25/2016 1105   LABSPEC 1.018 10/25/2016 1105   PHURINE 5.0 10/25/2016 1105   GLUCOSEU NEGATIVE 10/25/2016 1105   HGBUR SMALL (A) 10/25/2016 1105   BILIRUBINUR NEGATIVE 10/25/2016 1105   KETONESUR 20 (A) 10/25/2016 1105   PROTEINUR 30 (A) 10/25/2016 1105   NITRITE NEGATIVE 10/25/2016 1105   LEUKOCYTESUR TRACE (A) 10/25/2016 1105     RADIOLOGY: Dg  Chest 1 View  Result Date: 05/03/2017 CLINICAL DATA:  56 y/o  F;  short of breath. EXAM: CHEST 1 VIEW COMPARISON:  10/25/2016 chest radiograph FINDINGS: Stable normal cardiac silhouette. Moderate left-sided pneumothorax. No significant shift of the mediastinum. Stable linear opacities at lung bases compatible with scarring. No acute osseous abnormality is evident. IMPRESSION: Moderate left-sided pneumothorax. No significant shift of mediastinum. These results were called by telephone at the time of interpretation on 05/03/2017 at 12:51 am to Dr. Gladstone Pih , who verbally acknowledged these results. Electronically Signed   By: Mitzi Hansen M.D.   On: 05/03/2017 00:52   Dg Chest Portable 1 View  Result Date: 05/03/2017 CLINICAL DATA:  Left pneumothorax EXAM: PORTABLE CHEST 1 VIEW COMPARISON:  05/03/2017 at 00:26 FINDINGS: A left chest tube has been placed, with substantial reduction of the pneumothorax. Small pleural air collections are visible in the base and apex. Mild linear scarring or atelectasis is visible in both bases. No confluent airspace consolidation. Normal pulmonary vasculature. Normal heart size. Unremarkable hilar and mediastinal contours. IMPRESSION: Substantial reduction of left pneumothorax with placement of a chest tube. Electronically Signed   By: Ellery Plunk M.D.   On: 05/03/2017 02:12    EKG: Orders placed or performed during the hospital encounter of 05/03/17  . EKG 12-Lead  . EKG 12-Lead  . ED EKG  . ED EKG    IMPRESSION AND PLAN: 56 year old female patient with history of COPD on home oxygen presented to the emergency room with shortness of breath and cough and congestion. Admitting diagnosis 1. Acute respiratory distress 2. Left-sided pneumothorax 3. Acute bronchitis 4. Dyspnea Treatment plan Admit patient to stepdown unit Left-sided chest tube for pneumothorax connected to suction Appreciate surgical consultation and follow-up Intensivist consultation for management of chest tube IV Zithromax  antibiotic Oxygen via nasal cannula at 4 L  All the records are reviewed and case discussed with ED provider. Management plans discussed with the patient, family and they are in agreement.  CODE STATUS:FULL CODE Code Status History    This patient does not have a recorded code status. Please follow your organizational policy for patients in this situation.       TOTAL TIME TAKING CARE OF THIS PATIENT: 50 minutes.    Ihor Austin M.D on 05/03/2017 at 3:01 AM  Between 7am to 6pm - Pager - [phone]  After 6pm go to [email or web address] - password [email or web address] [facility]  [facility]  Office  [phone]  CC: Primary care physician; Cain Sieve, MD

## 2017-05-03 NOTE — Progress Notes (Signed)
From rounding w/Kasa, stop D5 1/2 NS W 20K

## 2017-05-03 NOTE — Progress Notes (Signed)
Chest tube plan Chest x-ray shows reexpansion a left-sided pneumothorax Continue chest tube to suction for the next 24 hours After 24 hours will clamp chest tube and assess for pneumothorax

## 2017-05-03 NOTE — ED Provider Notes (Addendum)
[facility] Emergency Department Provider Note  ____________________________________________   First MD Initiated Contact with Patient 05/03/17 0011     (approximate)  I have reviewed the triage vital signs and the nursing notes.   HISTORY  Chief Complaint Respiratory Distress   HPI Renee Cochran is a 56 y.o. female with a history of COPD on 2-3 L home O2 who is presenting with 2 weeks of worsening shortness of breath and productive cough. She is not reporting a fever. She says that she still smokes occasionally. Does report bilateral sharp thoracic pain which is worse with coughing. Was found to be in the low 80s on her home nasal cannula oxygen by EMS. She was started on albuterol and given 2 albuterol nebs as well as 125 mg of Solu-Medrol en route.   Past Medical History:  Diagnosis Date  . Asthma   . COPD (chronic obstructive pulmonary disease) (HCC)     There are no active problems to display for this patient.   History reviewed. No pertinent surgical history.  Prior to Admission medications   Medication Sig Start Date End Date Taking? Authorizing Provider  albuterol (PROVENTIL HFA;VENTOLIN HFA) 108 (90 Base) MCG/ACT inhaler Inhale 2 puffs into the lungs every 4 (four) hours as needed. 04/16/16   [provider]  albuterol (PROVENTIL) (2.5 MG/3ML) 0.083% nebulizer solution Take 2.5 mg by nebulization every 4 (four) hours as needed for wheezing or shortness of breath.    [provider]  Diclofenac Sodium 3 % GEL Place 1 application onto the skin every 12 (twelve) hours as needed (pain). 10/25/16   Myrna Blazer, MD  Fluticasone-Salmeterol (ADVAIR DISKUS) 250-50 MCG/DOSE AEPB Inhale 1 puff into the lungs 2 (two) times daily. 04/16/16   [provider]  ipratropium (ATROVENT) 0.02 % nebulizer solution Take 1 mcg by nebulization 3 (three) times daily.  10/14/16   [provider]  montelukast (SINGULAIR) 10  MG tablet Take 1 tablet by mouth at bedtime. 09/27/16   [provider]  tiotropium (SPIRIVA) 18 MCG inhalation capsule Place 18 mcg into inhaler and inhale daily.    [provider]    Allergies Penicillins and Sulfa antibiotics  No family history on file.  Social History Social History  Substance Use Topics  . Smoking status: Current Some Day Smoker    Packs/day: 0.50    Types: Cigarettes  . Smokeless tobacco: Never Used  . Alcohol use Not on file    Review of Systems  Constitutional: No fever/chills Eyes: No visual changes. ENT: No sore throat. Cardiovascular: as above. Respiratory: as above. Gastrointestinal: No abdominal pain.  No nausea, no vomiting.  No diarrhea.  No constipation. Genitourinary: Negative for dysuria. Musculoskeletal: Negative for back pain. Skin: Negative for rash. Neurological: Negative for headaches, focal weakness or numbness.   ____________________________________________   PHYSICAL EXAM:  VITAL SIGNS: ED Triage Vitals  Enc Vitals Group     BP 05/03/17 0007 (!) 164/79     Pulse Rate 05/03/17 0007 (!) 104     Resp 05/03/17 0007 (!) 29     Temp 05/03/17 0007 97.4 F (36.3 C)     Temp Source 05/03/17 0007 Axillary     SpO2 05/03/17 0007 (!) 82 %     Weight 05/03/17 0008 135 lb (61.2 kg)     Height 05/03/17 0008 5\' 2"  (1.575 m)     Head Circumference --      Peak Flow --  Pain Score 05/03/17 0007 10     Pain Loc --      Pain Edu? --      Excl. in GC? --     Constitutional: Alert and oriented. Mild distress. Labored respirations. Eyes: Conjunctivae are normal.  Head: Atraumatic. Nose: No congestion/rhinnorhea. Mouth/Throat: Mucous membranes are moist.  Neck: No stridor. Cardiovascular: Tachycardic, regular rhythm. Grossly normal heart sounds.   Respiratory: Labored respirations with mild tripoding and using accessory muscles. Severely decreased air movement throughout. Gastrointestinal: Soft and nontender.  No distention.  Musculoskeletal: No lower extremity tenderness nor edema.  No joint effusions. Neurologic:  Normal speech and language. No gross focal neurologic deficits are appreciated. Skin:  Skin is warm, dry and intact. No rash noted. Psychiatric: Mood and affect are normal. Speech and behavior are normal.  ____________________________________________   LABS (all labs ordered are listed, but only abnormal results are displayed)  Labs Reviewed  CBC WITH DIFFERENTIAL/PLATELET - Abnormal; Notable for the following:       Result Value   WBC 21.7 (*)    Neutro Abs 18.4 (*)    Monocytes Absolute 1.1 (*)    All other components within normal limits  BASIC METABOLIC PANEL - Abnormal; Notable for the following:    Chloride 100 (*)    Glucose, Bld 191 (*)    All other components within normal limits  TROPONIN I   ____________________________________________  EKG  ED ECG REPORT I, Arelia Longest, the attending physician, personally viewed and interpreted this ECG.   Date: 05/03/2017  EKG Time: 0006  Rate: 115  Rhythm: sinus tachycardia  Axis: Normal  Intervals:none  ST&T Change: No ST segment elevation or depression. No abnormal T-wave inversion.  ____________________________________________  RADIOLOGY  30-40% left-sided pneumothorax.  Repeat chest x-ray with satisfactory placement of the chest tube with reexpansion of the left lung. ____________________________________________   PROCEDURES  Procedure(s) performed:  CHEST TUBE INSERTION Date/Time: 05/03/2017 at 1:49 AM Performed by: Arelia Longest Consent: The procedure was performed in an emergent situation. Imaging studies: imaging studies available Required items: required blood products, implants, devices, and special equipment available Patient identity confirmed: arm band and available demographic data Time out: Immediately prior to procedure a "time out" was called to verify the correct patient,  procedure, equipment, support staff and site/side marked as required.  Indications: pneumothorax  Patient sedated: no Anesthesia: yes Preparation: skin prepped with chlorhexidine   Placement location: left anterior axillary  Scalpel size: 11 Tube size: 24 Dissection instrument: seldinger technique  Tension pneumothorax heard: yes Tube connected to: water seal Drainage characteristics: to wall suction Drainage amount: air  Suture material:  silk Dressing: vaseline gause covered with dry gauze and tape  Post-insertion x-ray findings: reexpansion  Patient tolerance: Patient tolerated the procedure well with no immediate complications      Procedures  Critical Care performed: Yes, see critical care note(s)  CRITICAL CARE Performed by: Arelia Longest   Total critical care time: 35 minutes  Critical care time was exclusive of separately billable procedures and treating other patients.  Critical care was necessary to treat or prevent imminent or life-threatening deterioration.  Critical care was time spent personally by me on the following activities: development of treatment plan with patient and/or surrogate as well as nursing, discussions with consultants, evaluation of patient's response to treatment, examination of patient, obtaining history from patient or surrogate, ordering and performing treatments and interventions, ordering and review of laboratory studies, ordering and review  of radiographic studies, pulse oximetry and re-evaluation of patient's condition.  ____________________________________________   INITIAL IMPRESSION / ASSESSMENT AND PLAN / ED COURSE  Pertinent labs & imaging results that were available during my care of the patient were reviewed by me and considered in my medical decision making (see chart for details).  ----------------------------------------- 1:54 AM on 05/03/2017 -----------------------------------------  Chest tube  inserted emergently because the patient was becoming hypoxic to 80-84% on BiPAP. Now after the chest tube the lung appears to have reexpanded.  Patient's respiratory rate has decreased. Significantly less work of breathing at this time. We'll attempt to wean from BiPAP. Will be dispositioned to the intensive care unit. Signed out to Dr. Collene SchlichterPyreddi.  Dr.  Tonita CongWoodham aware and was present for the chest tube insertion. He has also spoken to the ICU doctors and they are aware the patient will be coming to the intensive care unit. Also discussed the case with the patient's husband who is at the bedside. After chest tube insertion the patient's oxygen saturation is 94-96% on BiPAP.       ____________________________________________   FINAL CLINICAL IMPRESSION(S) / ED DIAGNOSES  COPD exacerbation. Left sided pneumothorax.    NEW MEDICATIONS STARTED DURING THIS VISIT:  New Prescriptions   No medications on file     Note:  This document was prepared using Dragon voice recognition software and may include unintentional dictation errors.     Myrna BlazerSchaevitz, Doneen Ollinger Matthew, MD 05/03/17 [phone]    Myrna BlazerSchaevitz, Larone Kliethermes Matthew, MD 05/03/17 [phone]

## 2017-05-03 NOTE — ED Notes (Signed)
Pt given ice chips

## 2017-05-03 NOTE — H&P (Signed)
Patient ID: Renee CenterBonnie Cochran, female   DOB: 30-Nov-1960, 56 y.o.   MRN: [id number]  CC: Shortness of breath  HPI Renee Cochran is a 56 y.o. female who is brought to the emergency department this evening due to worsening shortness of breath, productive cough, chest pain. She was found to be hypoxic by EMS and was brought emergently to the ER. General surgery was counseled for insertion of a chest tube. However, upon my entering the room the ER physician was in the process of placing said to due to worsening hypoxia despite BiPAP. The majority of my history has come from the chart secondary to patient on BiPAP and receiving medications to assist with the placement of chest tube.  HPI  Past Medical History:  Diagnosis Date  . Asthma   . COPD (chronic obstructive pulmonary disease) (HCC)     History reviewed. No pertinent surgical history.  Family history: Father and paternal grandmother with colon cancer otherwise no history of additional cancers, heart disease, diabetes.  Social History Social History  Substance Use Topics  . Smoking status: Current Some Day Smoker    Packs/day: 0.50    Types: Cigarettes  . Smokeless tobacco: Never Used  . Alcohol use Not on file    Allergies  Allergen Reactions  . Penicillins Anaphylaxis    Has patient had a PCN reaction causing immediate rash, facial/tongue/throat swelling, SOB or lightheadedness with hypotension: yes Has patient had a PCN reaction causing severe rash involving mucus membranes or skin necrosis: yes Has patient had a PCN reaction that required hospitalization yes Has patient had a PCN reaction occurring within the last 10 years: no If all of the above answers are "NO", then may proceed with Cephalosporin use.   . Sulfa Antibiotics Anaphylaxis    Current Facility-Administered Medications  Medication Dose Route Frequency Provider Last Rate Last Dose  . lidocaine (PF) (XYLOCAINE) 1 % injection 20 mL  20 mL Other Once Myrna BlazerSchaevitz,  David Matthew, MD      . lidocaine (PF) (XYLOCAINE) 1 % injection           . morphine 4 MG/ML injection 4 mg  4 mg Intravenous Once Myrna BlazerSchaevitz, David Matthew, MD       Current Outpatient Prescriptions  Medication Sig Dispense Refill  . albuterol (PROVENTIL HFA;VENTOLIN HFA) 108 (90 Base) MCG/ACT inhaler Inhale 2 puffs into the lungs every 4 (four) hours as needed.    Marland Kitchen. albuterol (PROVENTIL) (2.5 MG/3ML) 0.083% nebulizer solution Take 2.5 mg by nebulization every 4 (four) hours as needed for wheezing or shortness of breath.    . Diclofenac Sodium 3 % GEL Place 1 application onto the skin every 12 (twelve) hours as needed (pain). 50 g 0  . Fluticasone-Salmeterol (ADVAIR DISKUS) 250-50 MCG/DOSE AEPB Inhale 1 puff into the lungs 2 (two) times daily.    Marland Kitchen. ipratropium (ATROVENT) 0.02 % nebulizer solution Take 1 mcg by nebulization 3 (three) times daily.     . montelukast (SINGULAIR) 10 MG tablet Take 1 tablet by mouth at bedtime.    Marland Kitchen. tiotropium (SPIRIVA) 18 MCG inhalation capsule Place 18 mcg into inhaler and inhale daily.       Review of Systems A full review of systems was unable to be accomplished secondary to BiPAP, otherwise only noted complaints or shortness of breath, chest pain, cough.  Physical Exam Blood pressure (!) 111/56, pulse (!) 114, temperature 97.4 F (36.3 C), temperature source Axillary, resp. rate (!) 23, height 5\' 2"  (1.575 m), weight 61.2  kg (135 lb), SpO2 94 %. CONSTITUTIONAL: On BiPAP but no acute distress. EYES: Pupils are equal, round, and reactive to light,  EARS, NOSE, MOUTH AND THROAT: The oropharynx is clear. Hearing is intact to voice. LYMPH NODES:  Lymph nodes in the neck are normal. RESPIRATORY:  Lungs are coarse bilaterally secondary to BiPAP. Accessory muscle usage secondary to BiPAP. Left-sided chest tube currently in place with minimal air leak CARDIOVASCULAR: Heart is tachycardic GI: The abdomen is large, soft, nontender, and nondistended.  GU: Rectal  deferred.   MUSCULOSKELETAL: No cyanosis or edema.   SKIN: Turgor is good and there are no pathologic skin lesions or ulcers. NEUROLOGIC: Motor and sensation is grossly normal. Cranial nerves are grossly intact. PSYCH:  Unable to assess secondary to medication  Data Reviewed Images and labs reviewed. Labs showed leukocytosis of 21.7, elevated glucose at 191 but the remainder of the labs are within normal limits. Initial chest x-ray shows a moderate size left-sided pneumothorax. Post chest tube insertion shows resolution of the pneumothorax seen on previous x-ray. I have personally reviewed the patient's imaging, laboratory findings and medical records.    Assessment    Left-sided pneumothorax    Plan    56 year old female with COPD and new left-sided pneumothorax. Unable to assess whether or not this is worsened secondary to positive pressure ventilation or from underlying disease. Regardless, now patient has a chest tube in place. She'll need to be admitted to the ICU for BiPAP and further pulmonary assessments. Continue chest tube to 20 cm of water suction. Patient will be evaluated in the morning by Dr. Thelma Barge to determine if any further intervention is warranted from a surgical standpoint. The ER physician was to contact the hospitalist, however, patient needs ICU admission and those orders have been be placed.     Time spent with the patient was 50 minutes, with more than 50% of the time spent in face-to-face education, counseling and care coordination.     Ricarda Frame, MD FACS General Surgeon 05/03/2017, 1:53 AM

## 2017-05-03 NOTE — Progress Notes (Signed)
Per Dr Belia HemanKasa insert indwelling foley for acute urinary retention

## 2017-05-03 NOTE — Progress Notes (Signed)
Admitted for pneumothorax s/p chest tube.has chest tube in left lung.has pain at chest tube insertion site.labs and meds reviewed..has foley for urine retention.stable . Continue pulmocort nebs. Ct surgery is following.  Plan;monitor in  ICU   Closely. Time spent;25 min

## 2017-05-03 NOTE — ED Notes (Signed)
Respiratory called and notified to place pt on BIPAP

## 2017-05-03 NOTE — Progress Notes (Signed)
  Patient ID: Renee Cochran, female   DOB: Mar 28, 1961, 56 y.o.   MRN: [id number]  HISTORY: She states that she feels much better than when she first came in the hospital. Her breathing although still labored is not nearly as bad as it was. She does not describe pain in the left chest either. She does state that she has a history of recent tobacco use up to a half a pack per day. She also states that she's been otherwise he'll with nausea and vomiting and lack of energy. She thought that this might be related to her underlying COPD and because of the stress started smoking more. She has had a cough for the last several weeks as well.   Vitals:   05/03/17 0700 05/03/17 0800  BP: 115/68 (!) 92/46  Pulse: 93 (!) 102  Resp: 17 17  Temp:  97.4 F (36.3 C)     EXAM:    Resp: Lungs are Very distant bilaterally..  No respiratory distress, normal effort. Heart:  Regular without murmurs Abd:  Abdomen is soft, non distended and non tender. No masses are palpable.  There is no rebound and no guarding.  Neurological: Alert and oriented to person, place, and time. Coordination normal.  Skin: Skin is warm and dry. No rash noted. No diaphoretic. No erythema. No pallor.  Psychiatric: Normal mood and affect. Normal behavior. Judgment and thought content normal.   The chest tube is patent. There is titling in the waterseal chamber. There is no air leak.   ASSESSMENT: Spontaneous left-sided pneumothorax. I have independently reviewed her chest x-ray. There is almost complete expansion of the left lung. I suspect that the tube is likely within the fissure. The patient is currently improved and therefore I would not make any changes in the chest tube at this time.   PLAN:   We will continue the chest tube at 20 cm water suction. She can be temporarily discontinued from suction to ambulate or to be transported. I would repeat her chest x-ray tomorrow. I did discuss her care with Dr. Santiago Gladavid Kasa who is the  intensivist on call today.    Hulda Marinimothy Prapti Grussing, MD

## 2017-05-03 NOTE — ED Triage Notes (Signed)
Pt comes via [facility] from home with c/o SOB that has been on going for about 3 weeks. Pt states she has been trying to manage it at home with extra neb treatments. Pt states it got worse tonight. Per EMS pt was in low 80s upon arrival. EMS gave pt 2 duo neb treatments and one albuterol, pt was also given 125 of solumedrol. Accessory muscle and labored breathing upon arrival.

## 2017-05-03 NOTE — ED Notes (Addendum)
20 mL of Lidocaine pulled and pt numbed

## 2017-05-03 NOTE — ED Notes (Signed)
Pt taken off of BIPAP and put on 4L nasal cannula. Pt O2 currently 95%

## 2017-05-03 NOTE — Progress Notes (Signed)
Pharmacy note Antibiotic renal dosing consult: All medications reviewed. Patient is not currently on any antibiotics that require adjustment for renal function. Pharmacy will follow and manage as indicated.

## 2017-05-04 ENCOUNTER — Inpatient Hospital Stay: Payer: Medicare Other

## 2017-05-04 DIAGNOSIS — J939 Pneumothorax, unspecified: Secondary | ICD-10-CM

## 2017-05-04 LAB — GLUCOSE, CAPILLARY
GLUCOSE-CAPILLARY: 124 mg/dL — AB (ref 65–99)
GLUCOSE-CAPILLARY: 171 mg/dL — AB (ref 65–99)
Glucose-Capillary: 103 mg/dL — ABNORMAL HIGH (ref 65–99)
Glucose-Capillary: 133 mg/dL — ABNORMAL HIGH (ref 65–99)

## 2017-05-04 LAB — HEMOGLOBIN A1C
HEMOGLOBIN A1C: 5.2 % (ref 4.8–5.6)
Mean Plasma Glucose: 103 mg/dL

## 2017-05-04 LAB — HIV ANTIBODY (ROUTINE TESTING W REFLEX): HIV Screen 4th Generation wRfx: NONREACTIVE

## 2017-05-04 MED ORDER — GUAIFENESIN ER 600 MG PO TB12
600.0000 mg | ORAL_TABLET | Freq: Two times a day (BID) | ORAL | Status: AC
  Administered 2017-05-04 – 2017-05-05 (×4): 600 mg via ORAL
  Filled 2017-05-04 (×4): qty 1

## 2017-05-04 MED ORDER — FLUTICASONE PROPIONATE 50 MCG/ACT NA SUSP
1.0000 | Freq: Every day | NASAL | Status: DC
  Administered 2017-05-04 – 2017-05-06 (×3): 1 via NASAL
  Filled 2017-05-04: qty 16

## 2017-05-04 MED ORDER — MOMETASONE FURO-FORMOTEROL FUM 100-5 MCG/ACT IN AERO
2.0000 | INHALATION_SPRAY | Freq: Two times a day (BID) | RESPIRATORY_TRACT | Status: DC
  Administered 2017-05-04 – 2017-05-06 (×5): 2 via RESPIRATORY_TRACT
  Filled 2017-05-04: qty 8.8

## 2017-05-04 MED ORDER — TIOTROPIUM BROMIDE MONOHYDRATE 18 MCG IN CAPS
18.0000 ug | ORAL_CAPSULE | Freq: Every day | RESPIRATORY_TRACT | Status: DC
  Administered 2017-05-04 – 2017-05-06 (×3): 18 ug via RESPIRATORY_TRACT
  Filled 2017-05-04 (×2): qty 5

## 2017-05-04 NOTE — Progress Notes (Signed)
[facility] - [place] at [facility]   PATIENT NAME: Renee Cochran    MR#:  [id number]  DATE OF BIRTH:  Jul 26, 1961  SUBJECTIVE:seen at bedside.sob is improving.  CHIEF COMPLAINT:   Chief Complaint  Patient presents with  . Respiratory Distress    REVIEW OF SYSTEMS:   ROS CONSTITUTIONAL: No fever, fatigue or weakness.  EYES: No blurred or double vision.  EARS, NOSE, AND THROAT: No tinnitus or ear pain.  RESPIRATORY: No cough, shortness of breath, wheezing or hemoptysis.  CARDIOVASCULAR: No chest pain, orthopnea, edema.  GASTROINTESTINAL: No nausea, vomiting, diarrhea or abdominal pain.  GENITOURINARY: No dysuria, hematuria.  ENDOCRINE: No polyuria, nocturia,  HEMATOLOGY: No anemia, easy bruising or bleeding SKIN: No rash or lesion. MUSCULOSKELETAL: No joint pain or arthritis.   NEUROLOGIC: No tingling, numbness, weakness.  PSYCHIATRY: No anxiety or depression.   DRUG ALLERGIES:   Allergies  Allergen Reactions  . Basil Oil Anaphylaxis  . Garlic Anaphylaxis and Nausea And Vomiting  . Onion Anaphylaxis  . Penicillins Anaphylaxis    Has patient had a PCN reaction causing immediate rash, facial/tongue/throat swelling, SOB or lightheadedness with hypotension: yes Has patient had a PCN reaction causing severe rash involving mucus membranes or skin necrosis: yes Has patient had a PCN reaction that required hospitalization yes Has patient had a PCN reaction occurring within the last 10 years: no If all of the above answers are "NO", then may proceed with Cephalosporin use.   . Sulfa Antibiotics Anaphylaxis    VITALS:  Blood pressure (!) 142/72, pulse 89, temperature 98.4 F (36.9 C), resp. rate 13, height 5\' 2"  (1.575 m), weight 67.8 kg (149 lb 7.6 oz), SpO2 95 %.  PHYSICAL EXAMINATION:  GENERAL:  56 y.o.-year-old patient lying in the bed with no acute distress.  EYES: Pupils equal, round, reactive to light and accommodation. No scleral icterus.  Extraocular muscles intact.  HEENT: Head atraumatic, normocephalic. Oropharynx and nasopharynx clear.  NECK:  Supple, no jugular venous distention. No thyroid enlargement, no tenderness.  LUNGS: decreased breath sounds on left side.no wheezing.Marland Kitchen  CARDIOVASCULAR: S1, S2 normal. No murmurs, rubs, or gallops.  ABDOMEN: Soft, nontender, nondistended. Bowel sounds present. No organomegaly or mass.  EXTREMITIES: No pedal edema, cyanosis, or clubbing.  NEUROLOGIC: Cranial nerves II through XII are intact. Muscle strength 5/5 in all extremities. Sensation intact. Gait not checked.  PSYCHIATRIC: The patient is alert and oriented x 3.  SKIN: No obvious rash, lesion, or ulcer.    LABORATORY PANEL:   CBC  Recent Labs Lab 05/03/17 0341  WBC 14.8*  HGB 13.7  HCT 41.5  PLT 282   ------------------------------------------------------------------------------------------------------------------  Chemistries   Recent Labs Lab 05/03/17 0341  NA 139  K 3.6  CL 100*  CO2 31  GLUCOSE 207*  BUN 15  CREATININE 0.65  CALCIUM 8.7*  MG 2.9*   ------------------------------------------------------------------------------------------------------------------  Cardiac Enzymes  Recent Labs Lab 05/03/17 1755  TROPONINI <0.03   ------------------------------------------------------------------------------------------------------------------  RADIOLOGY:  Dg Chest 1 View  Result Date: 05/03/2017 CLINICAL DATA:  56 y/o  F; short of breath. EXAM: CHEST 1 VIEW COMPARISON:  10/25/2016 chest radiograph FINDINGS: Stable normal cardiac silhouette. Moderate left-sided pneumothorax. No significant shift of the mediastinum. Stable linear opacities at lung bases compatible with scarring. No acute osseous abnormality is evident. IMPRESSION: Moderate left-sided pneumothorax. No significant shift of mediastinum. These results were called by telephone at the time of interpretation on 05/03/2017 at 12:51 am to Dr.  Gladstone Pih , who verbally  acknowledged these results. Electronically Signed   By: Mitzi HansenLance  Furusawa-Stratton M.D.   On: 05/03/2017 00:52   Dg Chest Port 1 View  Result Date: 05/04/2017 CLINICAL DATA:  Follow-up left-sided pneumothorax. EXAM: PORTABLE CHEST 1 VIEW COMPARISON:  Chest x-ray of May 04, 2017 at 4:19 a.m. FINDINGS: The faint pleural line previously demonstrated is not clearly evident in the apex today. The left chest tube is in stable position. There is no mediastinal shift. There is stable linear increased density at the right lung base unchanged since October 25, 2016. The heart and pulmonary vascularity are normal. There is calcification in the wall of the aortic arch. There is old deformity of the posterolateral aspect of the right sixth rib. IMPRESSION: No definite residual apical pneumothorax on the right. Stable positioning of the chest tube. Stable right basilar scarring. Thoracic aortic atherosclerosis. Electronically Signed   By: David  SwazilandJordan M.D.   On: 05/04/2017 14:17   Dg Chest Port 1 View  Result Date: 05/04/2017 CLINICAL DATA:  Pneumothorax. EXAM: PORTABLE CHEST 1 VIEW COMPARISON:  05/03/2017. FINDINGS: Left chest tube in stable position. New very tiny residual left apical pneumothorax cannot be excluded. Mild bibasilar subsegmental atelectasis no pleural effusion. Heart size normal. IMPRESSION: 1. Left chest tube in stable position. Persistent very tiny left apical pneumothorax cannot be excluded. 2.  Mild bibasilar subsegmental atelectasis. Electronically Signed   By: Maisie Fushomas  Register   On: 05/04/2017 06:34   Dg Chest Portable 1 View  Result Date: 05/03/2017 CLINICAL DATA:  Left pneumothorax EXAM: PORTABLE CHEST 1 VIEW COMPARISON:  05/03/2017 at 00:26 FINDINGS: A left chest tube has been placed, with substantial reduction of the pneumothorax. Small pleural air collections are visible in the base and apex. Mild linear scarring or atelectasis is visible in both bases. No  confluent airspace consolidation. Normal pulmonary vasculature. Normal heart size. Unremarkable hilar and mediastinal contours. IMPRESSION: Substantial reduction of left pneumothorax with placement of a chest tube. Electronically Signed   By: Ellery Plunkaniel R Mitchell M.D.   On: 05/03/2017 02:12    EKG:   Orders placed or performed during the hospital encounter of 05/03/17  . EKG 12-Lead  . EKG 12-Lead  . ED EKG  . ED EKG  . EKG 12-Lead  . EKG 12-Lead    ASSESSMENT AND PLAN:  Left side pneumothorax/p chest tube.'clinically improving.;still has pleuritic chest pain.chest xray today evening,chest tube to water seal,surgery is following.  2.bilateral basal atelectasis;continue bronchodilators.abx.   3.trasnfere to off unit tele  All the records are reviewed and case discussed with Care Management/Social Workerr. Management plans discussed with the patient, family and they are in agreement.  CODE STATUS:full  TOTAL TIME TAKING CARE OF THIS PATIENT: 35 minutes.   POSSIBLE D/C IN 1-2DAYS, DEPENDING ON CLINICAL CONDITION.   Katha HammingKONIDENA,Marva Hendryx M.D on 05/04/2017 at 4:43 PM  Between 7am to 6pm - Pager - [phone]  After 6pm go to [email or web address] - password [email or web address]  [facility] [place] [facility]  Office  [phone]  CC: Primary care physician; Cain SieveKlipstein, Christopher, MD   Note: This dictation was prepared with Dragon dictation along with smaller phrase technology. Any transcriptional errors that result from this process are unintentional.

## 2017-05-04 NOTE — Progress Notes (Signed)
05/04/17  Repeat CXR viewed independently.  Chest tube currently on waterseal and no pneumothorax appreciated on CXR.  --Continue chest tube to waterseal. --Anticipate chest tube removal tomorrow. --Will repeat CXR tomorrow morning.   Howie IllJose Luis Jacelynn Hayton, MD [facility]

## 2017-05-04 NOTE — Progress Notes (Signed)
05/04/2017  Subjective: Patient reports improved breathing this morning.  Had some left sided pain last night which resolved this morning.  Complains of pain with the tube itself.  Vital signs: Temp:  [97.6 F (36.4 C)-98.7 F (37.1 C)] 98.4 F (36.9 C) (06/27 0400) Pulse Rate:  [76-105] 76 (06/27 0600) Resp:  [12-25] 22 (06/27 0600) BP: (111-155)/(55-90) 122/65 (06/27 0600) SpO2:  [95 %-99 %] 98 % (06/27 0600) Weight:  [67.8 kg (149 lb 7.6 oz)] 67.8 kg (149 lb 7.6 oz) (06/27 0500)   Intake/Output: 06/26 0701 - 06/27 0700 In: 1220 [P.O.:810; I.V.:400] Out: 1950 [Urine:1950] Last BM Date: 05/01/17  Physical Exam: Constitutional: No acute distress Pulm:  No respiratory distress, on nasal canula.  Left sided chest tube to suction without any airleak.   Labs:   Recent Labs  05/03/17 0013 05/03/17 0341  WBC 21.7* 14.8*  HGB 14.5 13.7  HCT 44.0 41.5  PLT 404 282    Recent Labs  05/03/17 0013 05/03/17 0341  NA 140 139  K 3.8 3.6  CL 100* 100*  CO2 32 31  GLUCOSE 191* 207*  BUN 14 15  CREATININE 0.67 0.65  CALCIUM 9.0 8.7*   No results for input(s): LABPROT, INR in the last 72 hours.  Imaging: Dg Chest Port 1 View  Result Date: 05/04/2017 CLINICAL DATA:  Pneumothorax. EXAM: PORTABLE CHEST 1 VIEW COMPARISON:  05/03/2017. FINDINGS: Left chest tube in stable position. New very tiny residual left apical pneumothorax cannot be excluded. Mild bibasilar subsegmental atelectasis no pleural effusion. Heart size normal. IMPRESSION: 1. Left chest tube in stable position. Persistent very tiny left apical pneumothorax cannot be excluded. 2.  Mild bibasilar subsegmental atelectasis. Electronically Signed   By: Maisie Fushomas  Register   On: 05/04/2017 06:34    Assessment/Plan: 56 yo female with left pneumothorax.  --CXR viewed independently by me.  Minimal residual pneumothorax, with overall stable CXR compared to yesterday.  --No air leak on pleuravac.  Will place chest tube to  waterseal today and repeat CXR this afternoon. --Will continue to follow.   Howie IllJose Luis Maria Gallicchio, MD [facility]

## 2017-05-04 NOTE — Care Management Note (Signed)
Case Management Note  Patient Details  Name: Renee Cochran MRN: [id number] Date of Birth: Dec 04, 1960  Subjective/Objective:                  Met with patient to discuss discharge planning and CM consult for medication assistance after patient consented. Patient is independent from home and able to drive. She resides with her husband and currently her son (just got out of jail) is staying with them "doing good".  She is on chronic O2 through [place]. She uses Product/process development scientist for medications. Her PCP is with [facility]. She cannot afford Advair and Spiriva inhalers. Patient has health insurance and drug coverage.   Action/Plan: Pharmacy requested for medication assistance- they agree to assist. No current RNCM needs. Chest tube to water seal.   Expected Discharge Date:                  Expected Discharge Plan:     In-House Referral:     Discharge planning Services  CM Consult, Medication Assistance  Post Acute Care Choice:    Choice offered to:  Patient  DME Arranged:    DME Agency:     HH Arranged:    [place] Agency:     Status of Service:  Completed, signed off  If discussed at [employer] of Stay Meetings, dates discussed:    Additional Comments:  Marshell Garfinkel, RN 05/04/2017, 11:49 AM

## 2017-05-05 ENCOUNTER — Inpatient Hospital Stay: Payer: Medicare Other

## 2017-05-05 LAB — EXPECTORATED SPUTUM ASSESSMENT W REFEX TO RESP CULTURE

## 2017-05-05 LAB — GLUCOSE, CAPILLARY
GLUCOSE-CAPILLARY: 112 mg/dL — AB (ref 65–99)
GLUCOSE-CAPILLARY: 215 mg/dL — AB (ref 65–99)
Glucose-Capillary: 89 mg/dL (ref 65–99)
Glucose-Capillary: 116 mg/dL — ABNORMAL HIGH (ref 65–99)

## 2017-05-05 LAB — EXPECTORATED SPUTUM ASSESSMENT W GRAM STAIN, RFLX TO RESP C

## 2017-05-05 LAB — PROCALCITONIN: PROCALCITONIN: 0.13 ng/mL

## 2017-05-05 NOTE — Progress Notes (Signed)
Blood glucose did not flow across verified in glucometer that blood sugar was 112 at 1233

## 2017-05-05 NOTE — Progress Notes (Signed)
Chaplain visited with pt. Pt was lying on the bed watching tv. CH interacted with pt and inquired how the pt was doing. Pt stated she was doing well this afternoon. Pt said she did not need pastoral care services at this time.

## 2017-05-05 NOTE — Progress Notes (Signed)
05/05/2017  Subjective: No acute events. Patient transferred out of ICU yesterday.  Reports improvement in her breathing.  Denies any significant pain.  Chest tube to waterseal yesterday with stable CXR yesterday.  Vital signs: Temp:  [97.9 F (36.6 C)-98.2 F (36.8 C)] 97.9 F (36.6 C) (06/28 0439) Pulse Rate:  [76-112] 81 (06/28 0439) Resp:  [11-29] 22 (06/28 0439) BP: (131-155)/(60-89) 153/70 (06/28 0439) SpO2:  [87 %-98 %] 97 % (06/28 0439) Weight:  [67.7 kg (149 lb 4.8 oz)] 67.7 kg (149 lb 4.8 oz) (06/27 1843)   Intake/Output: 06/27 0701 - 06/28 0700 In: 240 [P.O.:240] Out: 2000 [Urine:2000] Last BM Date: 05/02/17  Physical Exam: Constitutional: No acute distress Pulm:  Left chest tube in place, no air leak  Labs:   Recent Labs  05/03/17 0013 05/03/17 0341  WBC 21.7* 14.8*  HGB 14.5 13.7  HCT 44.0 41.5  PLT 404 282    Recent Labs  05/03/17 0013 05/03/17 0341  NA 140 139  K 3.8 3.6  CL 100* 100*  CO2 32 31  GLUCOSE 191* 207*  BUN 14 15  CREATININE 0.67 0.65  CALCIUM 9.0 8.7*   No results for input(s): LABPROT, INR in the last 72 hours.  Imaging: Dg Chest Port 1 View  Result Date: 05/05/2017 CLINICAL DATA:  Left pneumothorax. EXAM: PORTABLE CHEST 1 VIEW COMPARISON:  05/04/2017.  10/26/1999 FINDINGS: Left chest tube in stable position. No pneumothorax. Heart size normal. Right base subsegmental atelectasis and/or scarring again noted without interim change. IMPRESSION: 1. Left chest tube in stable position.  No pneumothorax. 2. Persistent right base subsegmental atelectasis/scarring. No interim change. Electronically Signed   By: Maisie Fushomas  Register   On: 05/05/2017 06:42   Dg Chest Port 1 View  Result Date: 05/04/2017 CLINICAL DATA:  Follow-up left-sided pneumothorax. EXAM: PORTABLE CHEST 1 VIEW COMPARISON:  Chest x-ray of May 04, 2017 at 4:19 a.m. FINDINGS: The faint pleural line previously demonstrated is not clearly evident in the apex today. The left  chest tube is in stable position. There is no mediastinal shift. There is stable linear increased density at the right lung base unchanged since October 25, 2016. The heart and pulmonary vascularity are normal. There is calcification in the wall of the aortic arch. There is old deformity of the posterolateral aspect of the right sixth rib. IMPRESSION: No definite residual apical pneumothorax on the right. Stable positioning of the chest tube. Stable right basilar scarring. Thoracic aortic atherosclerosis. Electronically Signed   By: David  SwazilandJordan M.D.   On: 05/04/2017 14:17    Assessment/Plan: 56 yo female with left sided pneumothorax.  --CXR this AM independently viewed and discussed with Dr. Thelma Bargeaks.  No pneumothorax noted, and chest tube without air leak on exam. --Chest tube to be removed today.   Howie IllJose Luis Camela Wich, MD [facility];

## 2017-05-05 NOTE — Progress Notes (Signed)
  Patient ID: Kennith CenterBonnie Grey, female   DOB: 10/06/1961, 56 y.o.   MRN: [id number]  HISTORY: She states that she feels better today with less shortness of breath and only minimal discomfort at chest tube site.   Vitals:   05/04/17 2154 05/05/17 0439  BP: 131/60 (!) 153/70  Pulse: 76 81  Resp: 20 (!) 22  Temp: 98 F (36.7 C) 97.9 F (36.6 C)     EXAM:    Resp: Lungs are distant bileterally.  No respiratory distress, normal effort. Heart:  Regular without murmurs Abd:  Abdomen is soft, non distended and non tender. No masses are palpable.  There is no rebound and no guarding.  Neurological: Alert and oriented to person, place, and time. Coordination normal.  Skin: Skin is warm and dry. No rash noted. No diaphoretic. No erythema. No pallor.  Psychiatric: Normal mood and affect. Normal behavior. Judgment and thought content normal.    ASSESSMENT: Spontaneous pneumothorax.  No air leak seen today.   PLAN:   Chest tube removed without incident.  Will continue to follow until hospital discharge.  Would repeat CXRay later today.    Hulda Marinimothy Israa Caban, MD

## 2017-05-05 NOTE — Progress Notes (Signed)
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  Pt transferred to medsurg unit 06/27 left sided chest tube removed 06/28 per thoracic surgery.    SUBJECTIVE:  Pt states her breathing has improved significantly and she no longer is short of breath   VITAL SIGNS: Temp:  [97.9 F (36.6 C)-98.2 F (36.8 C)] 97.9 F (36.6 C) (06/28 0439) Pulse Rate:  [76-112] 81 (06/28 0439) Resp:  [11-22] 22 (06/28 0439) BP: (131-155)/(60-89) 153/70 (06/28 0439) SpO2:  [93 %-98 %] 97 % (06/28 0439) Weight:  [67.7 kg (149 lb 4.8 oz)] 67.7 kg (149 lb 4.8 oz) (06/27 1843)  PHYSICAL EXAMINATION: General: chronically ill appearing Caucasian female, NAD  Neuro: alert and oriented, follows commands HEENT: supple, no JVD  Cardiovascular: s1s2, rrr, no M/R/G  Lungs: diminished throughout, even, non labored  Abdomen: +BS x4, soft, non tender, non distended Musculoskeletal: normal bulk and tone Skin: intact no rashes or lesions   Recent Labs Lab 05/03/17 0013 05/03/17 0341  NA 140 139  K 3.8 3.6  CL 100* 100*  CO2 32 31  BUN 14 15  CREATININE 0.67 0.65  GLUCOSE 191* 207*    Recent Labs Lab 05/03/17 0013 05/03/17 0341  HGB 14.5 13.7  HCT 44.0 41.5  WBC 21.7* 14.8*  PLT 404 282   Dg Chest Port 1 View  Result Date: 05/05/2017 CLINICAL DATA:  Left pneumothorax. EXAM: PORTABLE CHEST 1 VIEW COMPARISON:  05/04/2017.  10/26/1999 FINDINGS: Left chest tube in stable position. No pneumothorax. Heart size normal. Right base subsegmental atelectasis and/or scarring again noted without interim change. IMPRESSION: 1. Left chest tube in stable position.  No pneumothorax. 2. Persistent right base subsegmental atelectasis/scarring. No interim change. Electronically Signed    By: Maisie Fus  Register   On: 05/05/2017 06:42   Dg Chest Port 1 View  Result Date: 05/04/2017 CLINICAL DATA:  Follow-up left-sided pneumothorax. EXAM: PORTABLE CHEST 1 VIEW COMPARISON:  Chest x-ray of May 04, 2017 at 4:19 a.m. FINDINGS: The faint pleural line previously demonstrated is not clearly evident in the apex today. The left chest tube is in stable position. There is no mediastinal shift. There is stable linear increased density at the right lung base unchanged since October 25, 2016. The heart and pulmonary vascularity are normal. There is calcification in the wall of the aortic arch. There is old deformity of the posterolateral aspect of the right sixth rib. IMPRESSION: No definite residual apical pneumothorax on the right. Stable positioning of the chest tube. Stable right basilar scarring. Thoracic aortic atherosclerosis. Electronically Signed   By: David  Swaziland M.D.   On: 05/04/2017 14:17   Dg Chest Port 1 View  Result Date: 05/04/2017 CLINICAL DATA:  Pneumothorax. EXAM: PORTABLE CHEST 1 VIEW COMPARISON:  05/03/2017. FINDINGS: Left chest tube in stable position. New very tiny residual left apical pneumothorax cannot be excluded. Mild bibasilar subsegmental atelectasis no pleural effusion. Heart size normal. IMPRESSION: 1. Left chest tube in stable position. Persistent very tiny left apical pneumothorax cannot be excluded. 2.  Mild bibasilar subsegmental atelectasis. Electronically Signed   By: Maisie Fus  Register   On: 05/04/2017 06:34    ASSESSMENT / PLAN: Acute on chronic hypoxic respiratory failure secondary to AECOPD-improved   S/P left sided chest tube placement secondary to left sided moderate pneumothorax (  06/26)-resolved (chest tube removed by thoracic surgeon 05/04/17) Hx: Current Everyday Smoker, Chronic Home O2 at 2-3L, and Asthma P: Continue chronic O2 @2 -3L via nasal canula  Maintain O2 sats 88% to 92% Continue spiriva, singular, dulera, and prn albuterol  Continue  azithromycin-stop date 05/08/17 Upon discharge will need prednisone taper  Smoking Cessation counseling provided  Pt will need [facility]   -PCCM will sign off 05/05/2017 if you need further assistance please call the PCCM consult pager listed in AMION  thank you for the consultation.  Sonda Rumbleana Tinsley Everman, AGNP  Pulmonary/Critical Care Pager [phone] (please enter 7 digits) PCCM Consult Pager [phone] (please enter 7 digits)

## 2017-05-05 NOTE — Progress Notes (Signed)
[facility]   PATIENT NAME: Renee CenterBonnie Cochran    MR#:  [id number]  DATE OF BIRTH:  May 15, 1961  SUBJECTIVE: seen at bedside, she is doing better, chest tube is removed.   CHIEF COMPLAINT:   Chief Complaint  Patient presents with  . Respiratory Distress    REVIEW OF SYSTEMS:   ROS CONSTITUTIONAL: No fever, fatigue or weakness.  EYES: No blurred or double vision.  EARS, NOSE, AND THROAT: No tinnitus or ear pain.  RESPIRATORY: No cough, shortness of breath, wheezing or hemoptysis.  CARDIOVASCULAR: No chest pain, orthopnea, edema.  GASTROINTESTINAL: No nausea, vomiting, diarrhea or abdominal pain.  GENITOURINARY: No dysuria, hematuria.  ENDOCRINE: No polyuria, nocturia,  HEMATOLOGY: No anemia, easy bruising or bleeding SKIN: No rash or lesion. MUSCULOSKELETAL: No joint pain or arthritis.   NEUROLOGIC: No tingling, numbness, weakness.  PSYCHIATRY: No anxiety or depression.   DRUG ALLERGIES:   Allergies  Allergen Reactions  . Basil Oil Anaphylaxis  . Garlic Anaphylaxis and Nausea And Vomiting  . Onion Anaphylaxis  . Penicillins Anaphylaxis    Has patient had a PCN reaction causing immediate rash, facial/tongue/throat swelling, SOB or lightheadedness with hypotension: yes Has patient had a PCN reaction causing severe rash involving mucus membranes or skin necrosis: yes Has patient had a PCN reaction that required hospitalization yes Has patient had a PCN reaction occurring within the last 10 years: no If all of the above answers are "NO", then may proceed with Cephalosporin use.   . Sulfa Antibiotics Anaphylaxis    VITALS:  Blood pressure (!) 153/70, pulse 81, temperature 97.9 F (36.6 C), temperature source Oral, resp. rate (!) 22, height 5\' 2"  (1.575 m), weight 67.7 kg (149 lb 4.8 oz), SpO2 97 %.  PHYSICAL EXAMINATION:  GENERAL:  56 y.o.-year-old patient lying in the bed with no acute distress.  EYES: Pupils equal, round,  reactive to light and accommodation. No scleral icterus. Extraocular muscles intact.  HEENT: Head atraumatic, normocephalic. Oropharynx and nasopharynx clear.  NECK:  Supple, no jugular venous distention. No thyroid enlargement, no tenderness.  LUNGS: decreased breath sounds on left side.no wheezing.Marland Kitchen.  CARDIOVASCULAR: S1, S2 normal. No murmurs, rubs, or gallops.  ABDOMEN: Soft, nontender, nondistended. Bowel sounds present. No organomegaly or mass.  EXTREMITIES: No pedal edema, cyanosis, or clubbing.  NEUROLOGIC: Cranial nerves II through XII are intact. Muscle strength 5/5 in all extremities. Sensation intact. Gait not checked.  PSYCHIATRIC: The patient is alert and oriented x 3.  SKIN: No obvious rash, lesion, or ulcer.    LABORATORY PANEL:   CBC  Recent Labs Lab 05/03/17 0341  WBC 14.8*  HGB 13.7  HCT 41.5  PLT 282   ------------------------------------------------------------------------------------------------------------------  Chemistries   Recent Labs Lab 05/03/17 0341  NA 139  K 3.6  CL 100*  CO2 31  GLUCOSE 207*  BUN 15  CREATININE 0.65  CALCIUM 8.7*  MG 2.9*   ------------------------------------------------------------------------------------------------------------------  Cardiac Enzymes  Recent Labs Lab 05/03/17 1755  TROPONINI <0.03   ------------------------------------------------------------------------------------------------------------------  RADIOLOGY:  Dg Chest Port 1 View  Result Date: 05/05/2017 CLINICAL DATA:  Left pneumothorax. EXAM: PORTABLE CHEST 1 VIEW COMPARISON:  05/04/2017.  10/26/1999 FINDINGS: Left chest tube in stable position. No pneumothorax. Heart size normal. Right base subsegmental atelectasis and/or scarring again noted without interim change. IMPRESSION: 1. Left chest tube in stable position.  No pneumothorax. 2. Persistent right base subsegmental atelectasis/scarring. No interim change. Electronically Signed   By:  Maisie Fushomas  Register  On: 05/05/2017 06:42   Dg Chest Port 1 View  Result Date: 05/04/2017 CLINICAL DATA:  Follow-up left-sided pneumothorax. EXAM: PORTABLE CHEST 1 VIEW COMPARISON:  Chest x-ray of May 04, 2017 at 4:19 a.m. FINDINGS: The faint pleural line previously demonstrated is not clearly evident in the apex today. The left chest tube is in stable position. There is no mediastinal shift. There is stable linear increased density at the right lung base unchanged since October 25, 2016. The heart and pulmonary vascularity are normal. There is calcification in the wall of the aortic arch. There is old deformity of the posterolateral aspect of the right sixth rib. IMPRESSION: No definite residual apical pneumothorax on the right. Stable positioning of the chest tube. Stable right basilar scarring. Thoracic aortic atherosclerosis. Electronically Signed   By: David  [place] M.D.   On: 05/04/2017 14:17   Dg Chest Port 1 View  Result Date: 05/04/2017 CLINICAL DATA:  Pneumothorax. EXAM: PORTABLE CHEST 1 VIEW COMPARISON:  05/03/2017. FINDINGS: Left chest tube in stable position. New very tiny residual left apical pneumothorax cannot be excluded. Mild bibasilar subsegmental atelectasis no pleural effusion. Heart size normal. IMPRESSION: 1. Left chest tube in stable position. Persistent very tiny left apical pneumothorax cannot be excluded. 2.  Mild bibasilar subsegmental atelectasis. Electronically Signed   By: Maisie Fus  Register   On: 05/04/2017 06:34    EKG:   Orders placed or performed during the hospital encounter of 05/03/17  . EKG 12-Lead  . EKG 12-Lead  . ED EKG  . ED EKG  . EKG 12-Lead  . EKG 12-Lead    ASSESSMENT AND PLAN:  .Left pneumothorax ;status post chest tube that was removed today morning. No residual pneumothorax. Patient clinically doing better. Possible discharge tomorrow morning with antibiotics and prednisone. Rpt  chest x-ray again today evening. 2.bilateral basal  atelectasis;continue bronchodilators.abx.   Likely chest tomorrow morning. #3 chronic respiratory failure, on 3 litres of  Oxygen all the time.  All the records are reviewed and case discussed with Care Management/Social Workerr. Management plans discussed with the patient, family and they are in agreement.  CODE STATUS:full  TOTAL TIME TAKING CARE OF THIS PATIENT: 35 minutes.   POSSIBLE D/C IN 1-2DAYS, DEPENDING ON CLINICAL CONDITION.   Katha Hamming M.D on 05/05/2017 at 11:55 AM  Between 7am to 6pm - Pager - [phone]  After 6pm go to [email or web address] - password [email or web address]  [facility]  Office  [phone]  CC: Primary care physician; Cain Sieve, MD   Note: This dictation was prepared with Dragon dictation along with smaller phrase technology. Any transcriptional errors that result from this process are unintentional.

## 2017-05-06 LAB — GLUCOSE, CAPILLARY
GLUCOSE-CAPILLARY: 106 mg/dL — AB (ref 65–99)
Glucose-Capillary: 86 mg/dL (ref 65–99)

## 2017-05-06 MED ORDER — FLUTICASONE FUROATE-VILANTEROL 100-25 MCG/INH IN AEPB: 1.0000 | INHALATION_SPRAY | Freq: Every day | RESPIRATORY_TRACT | 1 refills | Status: DC

## 2017-05-06 MED ORDER — AZITHROMYCIN 250 MG PO TABS: ORAL_TABLET | ORAL | 0 refills | Status: DC

## 2017-05-06 MED ORDER — PREDNISONE 10 MG (21) PO TBPK: ORAL_TABLET | ORAL | 0 refills | Status: DC

## 2017-05-06 MED ORDER — FLUTICASONE PROPIONATE 50 MCG/ACT NA SUSP: 1.0000 | Freq: Every day | NASAL | 2 refills | Status: AC

## 2017-05-06 MED ORDER — GUAIFENESIN-DM 100-10 MG/5ML PO SYRP
5.0000 mL | ORAL_SOLUTION | ORAL | Status: DC | PRN
  Administered 2017-05-06: 5 mL via ORAL
  Filled 2017-05-06: qty 5

## 2017-05-06 NOTE — Progress Notes (Signed)
Verified in glucometer blood glucose of 106 at 12:00

## 2017-05-06 NOTE — Progress Notes (Signed)
Pt A and O x 4. VSS. Pt tolerating diet well. No complaints of pain or nausea. IV removed intact, prescriptions given. Pt voiced understanding of discharge instructions with no further questions. Pt discharged via wheelchair with O2 and nurse aide. Pt's husband came to pick her up and brought her tank from home.

## 2017-05-06 NOTE — Care Management Important Message (Signed)
Important Message  Patient Details  Name: Renee CenterBonnie Canniff MRN: [id number] Date of Birth: 07-22-1961   Medicare Important Message Given:  Other (see comment) (documented given and signed 05/03/17)    Chapman FitchBOWEN, Chaston Bradburn T, RN 05/06/2017, 11:50 AM

## 2017-05-06 NOTE — Care Management (Signed)
Chest tube removed 05/05/17.  Patient to discharge today.  [facility] due to lower outpatient cost to patient.  RNCM signing off

## 2017-05-07 LAB — CULTURE, RESPIRATORY

## 2017-05-07 LAB — CULTURE, RESPIRATORY W GRAM STAIN

## 2017-05-09 NOTE — Discharge Summary (Signed)
Karliah Kowalchuk, is a 56 y.o. female  DOB 02/20/1961  MRN [id number].  Admission date:  05/03/2017  Admitting Physician  Ihor Austin, MD  Discharge Date:  05/06/2017   Primary MD  Cain Sieve, MD  Recommendations for primary care physician for things to follow:  All of his primary doctor in one week Follow up with [facility] pulmonology in 2 weeks.   Admission Diagnosis  COPD exacerbation (HCC) [J44.1] Pneumothorax, unspecified type [J93.9] Respiratory failure (HCC) [J96.90]   Discharge Diagnosis  COPD exacerbation (HCC) [J44.1] Pneumothorax, unspecified type [J93.9] Respiratory failure (HCC) [J96.90]    Active Problems:   Pneumothorax on left   Respiratory failure [facility])      Past Medical History:  Diagnosis Date  . Asthma   . COPD (chronic obstructive pulmonary disease) (HCC)     Past Surgical History:  Procedure Laterality Date  . CESAREAN SECTION         History of present illness and  Hospital Course:     Kindly see H&P for history of present illness and admission details, please review complete Labs, Consult reports and Test reports for all details in brief  HPI  from the history and physical done on the day of admission 56 year old female patient with a COPD on oxygen 2-3 L at home comes in because of sudden worsening of  shortness of breath and she was found to have a spontaneous pneumothorax.  Hospital Course  Acute on chronic respiratory failure secondary to COPD exacerbation, left-sided moderate pneumothorax status post chest tube insertion by thoracic surgery, chest tube was inserted on June 26, patient chest tube was removed on June 28 because of improvement of  pneumothorax by serial chest x-rays and also clinically.. Patient initially required BiPAP but weaned off the BiPAP also.  Patient felt better, discharge her home with Spiriva, Singulair, Dulera, oxygen, she is on Premarin 2-3 days of oxygen. She still smokes and advised her to quit smoking. Patient is advised to follow with her [facility] pulmonology in one month. Discharge home with tapering course of prednisone, azithromycin, seen by pulmonary critical care and also CT surgery.     Discharge Condition:  Stable Follow UP  Follow-up Information    Cain Sieve, MD. Go on 05/12/2017.   Specialty:  Internal Medicine Why:  Thursday at 1:20pm for hospital follow-up Contact information: [address] [facility] [id number] [facility] [place] [address] [phone]             Discharge Instructions  and  Discharge Medications    Allergies as of 05/06/2017      Reactions   Basil Oil Anaphylaxis   Garlic Anaphylaxis, Nausea And Vomiting   Onion Anaphylaxis   Penicillins Anaphylaxis   Has patient had a PCN reaction causing immediate rash, facial/tongue/throat swelling, SOB or lightheadedness with hypotension: yes Has patient had a PCN reaction causing severe rash involving mucus membranes or skin necrosis: yes Has patient had a PCN reaction that required hospitalization yes Has patient had a PCN reaction occurring within the last 10 years: no If all of the above answers are "NO", then may proceed with Cephalosporin use.   Sulfa Antibiotics Anaphylaxis      Medication List    TAKE these medications   albuterol (2.5 MG/3ML) 0.083% nebulizer solution Commonly known as:  PROVENTIL Take 2.5 mg by nebulization every 4 (four) hours as needed for wheezing or shortness of breath.   albuterol 108 (90 Base) MCG/ACT inhaler Commonly known as:  PROVENTIL HFA;VENTOLIN HFA Inhale 2 puffs into the lungs every 4 (four) hours as needed.   azithromycin 250 MG tablet Commonly known as:  ZITHROMAX Daily till July 1st.   Diclofenac Sodium 3 % Gel Place 1 application onto the skin every 12  (twelve) hours as needed (pain).   fluticasone 50 MCG/ACT nasal spray Commonly known as:  FLONASE Place 1 spray into both nostrils daily.   fluticasone furoate-vilanterol 100-25 MCG/INH Aepb Commonly known as:  BREO ELLIPTA Inhale 1 puff into the lungs daily.   ipratropium 0.02 % nebulizer solution Commonly known as:  ATROVENT Take 1 mcg by nebulization 3 (three) times daily.   montelukast 10 MG tablet Commonly known as:  SINGULAIR Take 1 tablet by mouth at bedtime.   predniSONE 10 MG (21) Tbpk tablet Commonly known as:  STERAPRED UNI-PAK 21 TAB Taper by 10 mg daily   tiotropium 18 MCG inhalation capsule Commonly known as:  SPIRIVA Place 18 mcg into inhaler and inhale daily.         Diet and Activity recommendation: See Discharge Instructions above   Consults obtained -PCCM, CT surgery   Major procedures and Radiology Reports - PLEASE review detailed and final reports for all details, in brief -     Dg Chest 1 View  Result Date: 05/03/2017 CLINICAL DATA:  56 y/o  F; short of breath. EXAM: CHEST 1 VIEW COMPARISON:  10/25/2016 chest radiograph FINDINGS: Stable normal cardiac silhouette. Moderate left-sided pneumothorax. No significant shift of the mediastinum. Stable linear opacities at lung bases compatible with scarring. No acute osseous abnormality is evident. IMPRESSION: Moderate left-sided pneumothorax. No significant shift of mediastinum. These results were called by telephone at the time of interpretation on 05/03/2017 at 12:51 am to Dr. Gladstone PihAVID SCHAEVITZ , who verbally acknowledged these results. Electronically Signed   By: Mitzi HansenLance  Furusawa-Stratton M.D.   On: 05/03/2017 00:52   Dg Chest 2 View  Result Date: 05/05/2017 CLINICAL DATA:  Chest tube removal. EXAM: CHEST  2 VIEW COMPARISON:  05/05/2017.  05/04/2017. FINDINGS: Interim removal of left chest tube. No pneumothorax. Mediastinum and hilar structures are normal. Heart size normal. Right base subsegmental  atelectasis and/or scarring. IMPRESSION: 1. Interim removal of left chest tube.  No pneumothorax. 2. Right base subsegmental atelectasis and/or scarring. Electronically Signed   By: Maisie Fushomas  Register   On: 05/05/2017 12:07   Dg Chest Port 1 View  Result Date: 05/05/2017 CLINICAL DATA:  Left pneumothorax. EXAM: PORTABLE CHEST 1 VIEW COMPARISON:  05/04/2017.  10/26/1999 FINDINGS: Left chest tube in stable position. No pneumothorax. Heart size normal. Right base subsegmental atelectasis and/or scarring again noted without interim change. IMPRESSION: 1. Left chest tube in stable position.  No pneumothorax. 2. Persistent right base subsegmental atelectasis/scarring. No interim change. Electronically Signed   By: Maisie Fushomas  Register   On: 05/05/2017 06:42   Dg Chest Port 1 View  Result Date: 05/04/2017 CLINICAL DATA:  Follow-up left-sided pneumothorax. EXAM: PORTABLE CHEST 1 VIEW COMPARISON:  Chest x-ray of May 04, 2017 at 4:19 a.m. FINDINGS: The faint pleural line previously demonstrated is not clearly evident in the apex today. The left chest tube is in stable position. There is no mediastinal shift. There is stable linear increased density at the right lung base unchanged since October 25, 2016. The heart and pulmonary vascularity are normal. There is calcification in the wall of the aortic arch. There is old deformity of the posterolateral aspect of the right sixth rib. IMPRESSION: No definite residual apical pneumothorax on  the right. Stable positioning of the chest tube. Stable right basilar scarring. Thoracic aortic atherosclerosis. Electronically Signed   By: David  [place] M.D.   On: 05/04/2017 14:17   Dg Chest Port 1 View  Result Date: 05/04/2017 CLINICAL DATA:  Pneumothorax. EXAM: PORTABLE CHEST 1 VIEW COMPARISON:  05/03/2017. FINDINGS: Left chest tube in stable position. New very tiny residual left apical pneumothorax cannot be excluded. Mild bibasilar subsegmental atelectasis no pleural effusion.  Heart size normal. IMPRESSION: 1. Left chest tube in stable position. Persistent very tiny left apical pneumothorax cannot be excluded. 2.  Mild bibasilar subsegmental atelectasis. Electronically Signed   By: Maisie Fus  Register   On: 05/04/2017 06:34   Dg Chest Portable 1 View  Result Date: 05/03/2017 CLINICAL DATA:  Left pneumothorax EXAM: PORTABLE CHEST 1 VIEW COMPARISON:  05/03/2017 at 00:26 FINDINGS: A left chest tube has been placed, with substantial reduction of the pneumothorax. Small pleural air collections are visible in the base and apex. Mild linear scarring or atelectasis is visible in both bases. No confluent airspace consolidation. Normal pulmonary vasculature. Normal heart size. Unremarkable hilar and mediastinal contours. IMPRESSION: Substantial reduction of left pneumothorax with placement of a chest tube. Electronically Signed   By: Ellery Plunk M.D.   On: 05/03/2017 02:12    Micro Results    Recent Results (from the past 240 hour(s))  MRSA PCR Screening     Status: None   Collection Time: 05/03/17  2:10 AM  Result Value Ref Range Status   MRSA by PCR NEGATIVE NEGATIVE Final    Comment:        The GeneXpert MRSA Assay (FDA approved for NASAL specimens only), is one component of a comprehensive MRSA colonization surveillance program. It is not intended to diagnose MRSA infection nor to guide or monitor treatment for MRSA infections.   Culture, expectorated sputum-assessment     Status: None   Collection Time: 05/05/17 11:12 AM  Result Value Ref Range Status   Specimen Description EXPECTORATED SPUTUM  Final   Special Requests NONE  Final   Sputum evaluation THIS SPECIMEN IS ACCEPTABLE FOR SPUTUM CULTURE  Final   Report Status 05/05/2017 FINAL  Final  Culture, respiratory (NON-Expectorated)     Status: Abnormal   Collection Time: 05/05/17 11:12 AM  Result Value Ref Range Status   Specimen Description EXPECTORATED SPUTUM  Final   Special Requests NONE Reflexed  from [id number]  Final   Gram Stain   Final    ABUNDANT WBC PRESENT, PREDOMINANTLY PMN ABUNDANT GRAM POSITIVE RODS RARE GRAM POSITIVE COCCI IN PAIRS RARE GRAM NEGATIVE RODS RARE YEAST Performed at [facility], [address], [place], [place] [address]    Culture MULTIPLE ORGANISMS PRESENT, NONE PREDOMINANT (A)  Final   Report Status 05/07/2017 FINAL  Final       Today   Subjective:   Deshonda Cryderman today has no headache,no chest abdominal pain,no new weakness tingling or numbness, feels much better wants to go home today.   Objective:   Blood pressure (!) 162/77, pulse (!) 110, temperature 97.5 F (36.4 C), temperature source Oral, resp. rate (!) 22, height 5\' 2"  (1.575 m), weight 68.7 kg (151 lb 8 oz), SpO2 93 %.  No intake or output data in the 24 hours ending 05/09/17 1516  Exam Awake Alert, Oriented x 3, No new F.N deficits, Normal affect [place].AT,PERRAL Supple Neck,No JVD, No cervical lymphadenopathy appriciated.  Symmetrical Chest wall movement, Good air movement bilaterally, CTAB RRR,No Gallops,Rubs or new  Murmurs, No Parasternal Heave +ve B.Sounds, Abd Soft, Non tender, No organomegaly appriciated, No rebound -guarding or rigidity. No Cyanosis, Clubbing or edema, No new Rash or bruise  Data Review   CBC w Diff:  Lab Results  Component Value Date   WBC 14.8 (H) 05/03/2017   HGB 13.7 05/03/2017   HCT 41.5 05/03/2017   PLT 282 05/03/2017   LYMPHOPCT 9 05/03/2017   MONOPCT 5 05/03/2017   EOSPCT 1 05/03/2017   BASOPCT 0 05/03/2017    CMP:  Lab Results  Component Value Date   NA 139 05/03/2017   K 3.6 05/03/2017   CL 100 (L) 05/03/2017   CO2 31 05/03/2017   BUN 15 05/03/2017   CREATININE 0.65 05/03/2017   PROT 7.3 10/25/2016   ALBUMIN 4.0 10/25/2016   BILITOT 0.7 10/25/2016   ALKPHOS 81 10/25/2016   AST 19 10/25/2016   ALT 12 (L) 10/25/2016  .   Total Time in preparing paper work, data evaluation and todays exam - 35  minutes  Kailand Seda M.D on 05/06/2017 at 3:16 PM    Note: This dictation was prepared with Dragon dictation along with smaller phrase technology. Any transcriptional errors that result from this process are unintentional.

## 2017-06-29 ENCOUNTER — Emergency Department: Payer: Medicare Other

## 2017-06-29 ENCOUNTER — Inpatient Hospital Stay
Admission: EM | Admit: 2017-06-29 | Discharge: 2017-07-01 | DRG: 192 | Disposition: A | Discharge status: Home / Self Care | Payer: Medicare Other | Attending: Internal Medicine | Admitting: Internal Medicine

## 2017-06-29 DIAGNOSIS — J441 Chronic obstructive pulmonary disease with (acute) exacerbation: Secondary | ICD-10-CM | POA: Diagnosis present

## 2017-06-29 DIAGNOSIS — F17210 Nicotine dependence, cigarettes, uncomplicated: Secondary | ICD-10-CM | POA: Diagnosis present

## 2017-06-29 DIAGNOSIS — Z79899 Other long term (current) drug therapy: Secondary | ICD-10-CM | POA: Diagnosis not present

## 2017-06-29 DIAGNOSIS — T380X5A Adverse effect of glucocorticoids and synthetic analogues, initial encounter: Secondary | ICD-10-CM | POA: Diagnosis not present

## 2017-06-29 DIAGNOSIS — Z882 Allergy status to sulfonamides status: Secondary | ICD-10-CM

## 2017-06-29 DIAGNOSIS — Z7951 Long term (current) use of inhaled steroids: Secondary | ICD-10-CM

## 2017-06-29 DIAGNOSIS — R071 Chest pain on breathing: Secondary | ICD-10-CM | POA: Diagnosis present

## 2017-06-29 DIAGNOSIS — R739 Hyperglycemia, unspecified: Secondary | ICD-10-CM | POA: Diagnosis not present

## 2017-06-29 DIAGNOSIS — Z91018 Allergy to other foods: Secondary | ICD-10-CM | POA: Diagnosis not present

## 2017-06-29 DIAGNOSIS — Z880 Allergy status to penicillin: Secondary | ICD-10-CM | POA: Diagnosis not present

## 2017-06-29 LAB — COMPREHENSIVE METABOLIC PANEL
ALT: 32 U/L (ref 14–54)
AST: 37 U/L (ref 15–41)
Albumin: 4.3 g/dL (ref 3.5–5.0)
Alkaline Phosphatase: 71 U/L (ref 38–126)
Anion gap: 9 (ref 5–15)
BILIRUBIN TOTAL: 0.6 mg/dL (ref 0.3–1.2)
BUN: 12 mg/dL (ref 6–20)
CHLORIDE: 102 mmol/L (ref 101–111)
CO2: 28 mmol/L (ref 22–32)
Calcium: 9.7 mg/dL (ref 8.9–10.3)
Creatinine, Ser: 0.58 mg/dL (ref 0.44–1.00)
Glucose, Bld: 104 mg/dL — ABNORMAL HIGH (ref 65–99)
POTASSIUM: 4.3 mmol/L (ref 3.5–5.1)
Sodium: 139 mmol/L (ref 135–145)
TOTAL PROTEIN: 7.5 g/dL (ref 6.5–8.1)

## 2017-06-29 LAB — CBC WITH DIFFERENTIAL/PLATELET
BASOS ABS: 0 10*3/uL (ref 0–0.1)
BASOS PCT: 0 %
EOS ABS: 0.3 10*3/uL (ref 0–0.7)
Eosinophils Relative: 4 %
HCT: 41.7 % (ref 35.0–47.0)
HEMOGLOBIN: 14 g/dL (ref 12.0–16.0)
Lymphocytes Relative: 12 %
Lymphs Abs: 1.1 10*3/uL (ref 1.0–3.6)
MCH: 32.3 pg (ref 26.0–34.0)
MCHC: 33.7 g/dL (ref 32.0–36.0)
MCV: 95.8 fL (ref 80.0–100.0)
MONO ABS: 0.8 10*3/uL (ref 0.2–0.9)
MONOS PCT: 10 %
NEUTROS ABS: 6.5 10*3/uL (ref 1.4–6.5)
NEUTROS PCT: 74 %
Platelets: 313 10*3/uL (ref 150–440)
RBC: 4.36 MIL/uL (ref 3.80–5.20)
RDW: 13.7 % (ref 11.5–14.5)
WBC: 8.8 10*3/uL (ref 3.6–11.0)

## 2017-06-29 LAB — FIBRIN DERIVATIVES D-DIMER (ARMC ONLY): FIBRIN DERIVATIVES D-DIMER (ARMC): 486.85 (ref 0.00–499.00)

## 2017-06-29 LAB — BRAIN NATRIURETIC PEPTIDE: B Natriuretic Peptide: 13 pg/mL (ref 0.0–100.0)

## 2017-06-29 LAB — TROPONIN I

## 2017-06-29 MED ORDER — IPRATROPIUM-ALBUTEROL 0.5-2.5 (3) MG/3ML IN SOLN
3.0000 mL | Freq: Once | RESPIRATORY_TRACT | Status: AC
Start: 2017-06-29 — End: 2017-06-29
  Administered 2017-06-29: 3 mL via RESPIRATORY_TRACT
  Filled 2017-06-29: qty 3

## 2017-06-29 MED ORDER — ACETAMINOPHEN 325 MG PO TABS
650.0000 mg | ORAL_TABLET | Freq: Four times a day (QID) | ORAL | Status: DC | PRN
  Administered 2017-06-29 – 2017-07-01 (×4): 650 mg via ORAL
  Filled 2017-06-29 (×4): qty 2

## 2017-06-29 MED ORDER — TIOTROPIUM BROMIDE MONOHYDRATE 18 MCG IN CAPS
18.0000 ug | ORAL_CAPSULE | Freq: Every day | RESPIRATORY_TRACT | Status: DC
  Administered 2017-07-01: 18 ug via RESPIRATORY_TRACT
  Filled 2017-06-29: qty 5

## 2017-06-29 MED ORDER — METHYLPREDNISOLONE SODIUM SUCC 125 MG IJ SOLR
60.0000 mg | Freq: Four times a day (QID) | INTRAMUSCULAR | Status: DC
  Administered 2017-06-30 (×2): 60 mg via INTRAVENOUS
  Filled 2017-06-29 (×2): qty 2

## 2017-06-29 MED ORDER — ENOXAPARIN SODIUM 40 MG/0.4ML ~~LOC~~ SOLN
40.0000 mg | SUBCUTANEOUS | Status: DC
  Administered 2017-06-30: 40 mg via SUBCUTANEOUS
  Filled 2017-06-29: qty 0.4

## 2017-06-29 MED ORDER — IPRATROPIUM-ALBUTEROL 0.5-2.5 (3) MG/3ML IN SOLN
3.0000 mL | Freq: Once | RESPIRATORY_TRACT | Status: AC
  Administered 2017-06-29: 3 mL via RESPIRATORY_TRACT
  Filled 2017-06-29: qty 3

## 2017-06-29 MED ORDER — LEVALBUTEROL HCL 1.25 MG/0.5ML IN NEBU: 1.2500 mg | INHALATION_SOLUTION | Freq: Four times a day (QID) | RESPIRATORY_TRACT | Status: DC | PRN

## 2017-06-29 MED ORDER — ONDANSETRON HCL 4 MG/2ML IJ SOLN: 4.0000 mg | Freq: Four times a day (QID) | INTRAMUSCULAR | Status: DC | PRN

## 2017-06-29 MED ORDER — IPRATROPIUM BROMIDE 0.02 % IN SOLN: 0.5000 mg | RESPIRATORY_TRACT | Status: DC | PRN

## 2017-06-29 MED ORDER — ACETAMINOPHEN 650 MG RE SUPP: 650.0000 mg | Freq: Four times a day (QID) | RECTAL | Status: DC | PRN

## 2017-06-29 MED ORDER — FLUTICASONE FUROATE-VILANTEROL 100-25 MCG/INH IN AEPB
1.0000 | INHALATION_SPRAY | Freq: Every day | RESPIRATORY_TRACT | Status: DC
  Filled 2017-06-29: qty 28

## 2017-06-29 MED ORDER — ALBUTEROL SULFATE (2.5 MG/3ML) 0.083% IN NEBU
INHALATION_SOLUTION | RESPIRATORY_TRACT | Status: AC
  Filled 2017-06-29: qty 3

## 2017-06-29 MED ORDER — MONTELUKAST SODIUM 10 MG PO TABS
10.0000 mg | ORAL_TABLET | Freq: Every day | ORAL | Status: DC
  Administered 2017-06-30: 10 mg via ORAL
  Filled 2017-06-29 (×2): qty 1

## 2017-06-29 MED ORDER — METHYLPREDNISOLONE SODIUM SUCC 125 MG IJ SOLR
125.0000 mg | Freq: Once | INTRAMUSCULAR | Status: AC
  Administered 2017-06-29: 125 mg via INTRAVENOUS
  Filled 2017-06-29: qty 2

## 2017-06-29 MED ORDER — AZITHROMYCIN 250 MG PO TABS
500.0000 mg | ORAL_TABLET | Freq: Every day | ORAL | Status: AC
  Administered 2017-06-29 – 2017-07-01 (×3): 500 mg via ORAL
  Filled 2017-06-29 (×3): qty 2

## 2017-06-29 MED ORDER — ONDANSETRON HCL 4 MG PO TABS: 4.0000 mg | ORAL_TABLET | Freq: Four times a day (QID) | ORAL | Status: DC | PRN

## 2017-06-29 NOTE — ED Provider Notes (Signed)
[facility] Emergency Department Provider Note   ____________________________________________   First MD Initiated Contact with Patient 06/29/17 1955     (approximate)  I have reviewed the triage vital signs and the nursing notes.   HISTORY  Chief Complaint Shortness of Breath    HPI Renee Cochran is a 56 y.o. female Complains of shortness of breath and painin her chest especially underneath  Last day or 2 possibly longer. She was coughing up some phlegm that turned yellow todayShe has not been running a fever.she had a breathing treatment today at home but did not help much so she came to the hospital. The pain she is having is mostly under the ribsund the chest worse with deep breathing sharp and tight.   Past Medical History:  Diagnosis Date  . Asthma   . COPD (chronic obstructive pulmonary disease) [facility])     Patient Active Problem List   Diagnosis Date Noted  . Pneumothorax on left 05/03/2017  . Respiratory failure (HCC) 05/03/2017    Past Surgical History:  Procedure Laterality Date  . CESAREAN SECTION      Prior to Admission medications   Medication Sig Start Date End Date Taking? Authorizing Provider  albuterol (PROVENTIL HFA;VENTOLIN HFA) 108 (90 Base) MCG/ACT inhaler Inhale 2 puffs into the lungs every 4 (four) hours as needed. 04/16/16   [provider]  albuterol (PROVENTIL) (2.5 MG/3ML) 0.083% nebulizer solution Take 2.5 mg by nebulization every 4 (four) hours as needed for wheezing or shortness of breath.    [provider]  azithromycin (ZITHROMAX) 250 MG tablet Daily till July 1st. 05/07/17   Katha Hamming, MD  Diclofenac Sodium 3 % GEL Place 1 application onto the skin every 12 (twelve) hours as needed (pain). Patient not taking: Reported on 05/04/2017 10/25/16   Myrna Blazer, MD  fluticasone [facility]) 50 MCG/ACT nasal spray Place 1 spray into both nostrils daily. 05/07/17   Katha Hamming,  MD  fluticasone furoate-vilanterol (BREO ELLIPTA) 100-25 MCG/INH AEPB Inhale 1 puff into the lungs daily. 05/06/17   Katha Hamming, MD  ipratropium (ATROVENT) 0.02 % nebulizer solution Take 1 mcg by nebulization 3 (three) times daily.  10/14/16   [provider]  montelukast (SINGULAIR) 10 MG tablet Take 1 tablet by mouth at bedtime. 09/27/16   [provider]  predniSONE (STERAPRED UNI-PAK 21 TAB) 10 MG (21) TBPK tablet Taper by 10 mg daily 05/06/17   Katha Hamming, MD  tiotropium (SPIRIVA) 18 MCG inhalation capsule Place 18 mcg into inhaler and inhale daily.    [provider]    Allergies Basil oil; Garlic; Onion; Penicillins; and Sulfa antibiotics  Family History  Problem Relation Age of Onset  . Colon cancer Father   . Colon cancer Paternal Grandfather   . Diabetes Neg Hx   . Hypertension Neg Hx     Social History Social History  Substance Use Topics  . Smoking status: Current Some Day Smoker    Packs/day: 0.50    Types: Cigarettes  . Smokeless tobacco: Never Used  . Alcohol use No    Review of Systems  Constitutional: No fever/chills Eyes: No visual changes. ENT: No sore throat. Cardiovascular:  chest pain. Respiratory:  shortness of breath. Gastrointestinal: No abdominal pain.  No nausea, no vomiting.  No diarrhea.  No constipation. Genitourinary: Negative for dysuria. Musculoskeletal: Negative for back pain. Skin: Negative for rash. Neurological: Negative for headaches, focal weakness   ____________________________________________   PHYSICAL EXAM:  VITAL SIGNS:  ED Triage Vitals  Enc Vitals Group     BP 06/29/17 1958 (!) 192/143     Pulse Rate 06/29/17 1958 (!) 129     Resp 06/29/17 1958 (!) 24     Temp 06/29/17 1958 98 F (36.7 C)     Temp Source 06/29/17 1958 Oral     SpO2 06/29/17 1958 95 %     Weight 06/29/17 2001 149 lb (67.6 kg)     Height 06/29/17 2001 5\' 3"  (1.6 m)     Head Circumference --      Peak Flow  --      Pain Score 06/29/17 1957 10     Pain Loc --      Pain Edu? --      Excl. in GC? --     Constitutional: Alert and oriented. Using her accessory muscles and bracing her back against the stretcher sitting very straight. Eyes: Conjunctivae are normal.  Head: Atraumatic. Nose: No congestion/rhinnorhea. Mouth/Throat: Mucous membranes are moist.  Oropharynx non-erythematous. Neck: No stridor.  Cardiovascular:rapid rate, regular rhythm. Grossly normal heart sounds.  Good peripheral circulation. Respiratory: Normal respiratory effort.  No retractions. Lungsdecreased air movement Gastrointestinal: Soft and nontender. No distention. No abdominal bruits. No CVA tenderness. Musculoskeletal: No lower extremity tenderness nor edema.  No joint effusions. Neurologic:  Normal speech and language. No gross focal neurologic deficits are appreciated.  Skin:  Skin is warm, dry and intact. No rash noted. Psychiatric: Mood and affect are normal. Speech and behavior are normal.  ____________________________________________   LABS (all labs ordered are listed, but only abnormal results are displayed)  Labs Reviewed  COMPREHENSIVE METABOLIC PANEL - Abnormal; Notable for the following:       Result Value   Glucose, Bld 104 (*)    All other components within normal limits  BRAIN NATRIURETIC PEPTIDE  TROPONIN I  CBC WITH DIFFERENTIAL/PLATELET  FIBRIN DERIVATIVES D-DIMER ([facility] ONLY)   ____________________________________________  EKG  EKG read and interpreted by me shows sinus tachycardia rate of 110 normal axis no acute ST-T wave changes ____________________________________________  RADIOLOGY  Dg Chest Portable 1 View  Result Date: 06/29/2017 CLINICAL DATA:  56 y/o  F; shortness of breath and chest pain. EXAM: PORTABLE CHEST 1 VIEW COMPARISON:  05/05/2017 chest radiograph FINDINGS: Chronic right posterolateral rib fractures. Streaky opacities in the lung bases probably represent minor  atelectasis. No consolidation, effusion, or pneumothorax. Stable normal cardiac silhouette. IMPRESSION: Streaky opacities in the lung bases, probably minor atelectasis. Stable normal cardiac silhouette. Electronically Signed   By: Mitzi Hansen M.D.   On: 06/29/2017 20:28    ____________________________________________   PROCEDURES  Procedure(s) performed:  Procedures  Critical Care performed:   ____________________________________________   INITIAL IMPRESSION / ASSESSMENT AND PLAN / ED COURSE  Pertinent labs & imaging results that were available during my care of the patient were reviewed by me and considered in my medical decision making (see chart for details).        ____________________________________________   FINAL CLINICAL IMPRESSION(S) / ED DIAGNOSES  Final diagnoses:  COPD exacerbation (HCC)      NEW MEDICATIONS STARTED DURING THIS VISIT:  New Prescriptions   No medications on file     Note:  This document was prepared using Dragon voice recognition software and may include unintentional dictation errors.    Arnaldo Natal, MD 06/29/17 2113

## 2017-06-29 NOTE — ED Notes (Addendum)
Pt still complaining of chest pain level 8. States that as soon as she tries to move the pain shoots back up to a 10. Placed pt back on 3L nasal cannula.

## 2017-06-29 NOTE — H&P (Signed)
[facility] - [place] at [facility]   PATIENT NAME: Renee Cochran    MR#:  [id number]  DATE OF BIRTH:  Sep 19, 1961  DATE OF ADMISSION:  06/29/2017  PRIMARY CARE PHYSICIAN: Cain Sieve, MD   REQUESTING/REFERRING PHYSICIAN: Darnelle Catalan, MD  CHIEF COMPLAINT:   Chief Complaint  Patient presents with  . Shortness of Breath    HISTORY OF PRESENT ILLNESS:  Renee Cochran  is a 56 y.o. female who presents with Progressive shortness of breath and increased wheezing for the past 3 days. She has also had an increased sputum. Here in the ED she did not have any pneumonia seen on chest x-ray. No other clear signs of infection, patient denies any other infectious symptoms. She was treated for COPD exacerbation with some improvement in the ED, but still has significant wheezing and air flow limitation. Hospitalists were called for admission.  PAST MEDICAL HISTORY:   Past Medical History:  Diagnosis Date  . Asthma   . COPD (chronic obstructive pulmonary disease) (HCC)     PAST SURGICAL HISTORY:   Past Surgical History:  Procedure Laterality Date  . CESAREAN SECTION      SOCIAL HISTORY:   Social History  Substance Use Topics  . Smoking status: Current Some Day Smoker    Packs/day: 0.50    Types: Cigarettes  . Smokeless tobacco: Never Used  . Alcohol use No    FAMILY HISTORY:   Family History  Problem Relation Age of Onset  . Colon cancer Father   . Colon cancer Paternal Grandfather   . Diabetes Neg Hx   . Hypertension Neg Hx     DRUG ALLERGIES:   Allergies  Allergen Reactions  . Basil Oil Anaphylaxis  . Garlic Anaphylaxis and Nausea And Vomiting  . Onion Anaphylaxis  . Penicillins Anaphylaxis    Has patient had a PCN reaction causing immediate rash, facial/tongue/throat swelling, SOB or lightheadedness with hypotension: yes Has patient had a PCN reaction causing severe rash involving mucus membranes or skin necrosis: yes Has patient  had a PCN reaction that required hospitalization yes Has patient had a PCN reaction occurring within the last 10 years: no If all of the above answers are "NO", then may proceed with Cephalosporin use.   . Sulfa Antibiotics Anaphylaxis    MEDICATIONS AT HOME:   Prior to Admission medications   Medication Sig Start Date End Date Taking? Authorizing Provider  albuterol (PROVENTIL HFA;VENTOLIN HFA) 108 (90 Base) MCG/ACT inhaler Inhale 2 puffs into the lungs every 4 (four) hours as needed. 04/16/16   [provider]  albuterol (PROVENTIL) (2.5 MG/3ML) 0.083% nebulizer solution Take 2.5 mg by nebulization every 4 (four) hours as needed for wheezing or shortness of breath.    [provider]  azithromycin (ZITHROMAX) 250 MG tablet Daily till July 1st. 05/07/17   Katha Hamming, MD  Diclofenac Sodium 3 % GEL Place 1 application onto the skin every 12 (twelve) hours as needed (pain). Patient not taking: Reported on 05/04/2017 10/25/16   Myrna Blazer, MD  fluticasone [facility]) 50 MCG/ACT nasal spray Place 1 spray into both nostrils daily. 05/07/17   Katha Hamming, MD  fluticasone furoate-vilanterol (BREO ELLIPTA) 100-25 MCG/INH AEPB Inhale 1 puff into the lungs daily. 05/06/17   Katha Hamming, MD  ipratropium (ATROVENT) 0.02 % nebulizer solution Take 1 mcg by nebulization 3 (three) times daily.  10/14/16   [provider]  montelukast (SINGULAIR) 10 MG tablet Take 1 tablet by mouth at bedtime. 09/27/16  [provider]  predniSONE (STERAPRED UNI-PAK 21 TAB) 10 MG (21) TBPK tablet Taper by 10 mg daily 05/06/17   Katha Hamming, MD  tiotropium (SPIRIVA) 18 MCG inhalation capsule Place 18 mcg into inhaler and inhale daily.    [provider]    REVIEW OF SYSTEMS:  Review of Systems  Constitutional: Negative for chills, fever, malaise/fatigue and weight loss.  HENT: Negative for ear pain, hearing loss and tinnitus.   Eyes:  Negative for blurred vision, double vision, pain and redness.  Respiratory: Positive for sputum production, shortness of breath and wheezing. Negative for cough and hemoptysis.   Cardiovascular: Negative for chest pain, palpitations, orthopnea and leg swelling.  Gastrointestinal: Negative for abdominal pain, constipation, diarrhea, nausea and vomiting.  Genitourinary: Negative for dysuria, frequency and hematuria.  Musculoskeletal: Negative for back pain, joint pain and neck pain.  Skin:       No acne, rash, or lesions  Neurological: Negative for dizziness, tremors, focal weakness and weakness.  Endo/Heme/Allergies: Negative for polydipsia. Does not bruise/bleed easily.  Psychiatric/Behavioral: Negative for depression. The patient is not nervous/anxious and does not have insomnia.      VITAL SIGNS:   Vitals:   06/29/17 2025 06/29/17 2030 06/29/17 2100 06/29/17 2130  BP: (!) 174/87 138/89 139/72 (!) 158/98  Pulse: (!) 111 (!) 108 (!) 116 (!) 118  Resp: (!) 24 (!) 21 18 (!) 21  Temp:      TempSrc:      SpO2: 99% 97% 96% 95%  Weight:      Height:       Wt Readings from Last 3 Encounters:  06/29/17 67.6 kg (149 lb)  05/06/17 68.7 kg (151 lb 8 oz)  10/25/16 63.5 kg (140 lb)    PHYSICAL EXAMINATION:  Physical Exam  Vitals reviewed. Constitutional: She is oriented to person, place, and time. She appears well-developed and well-nourished. No distress.  HENT:  Head: Normocephalic and atraumatic.  Mouth/Throat: Oropharynx is clear and moist.  Eyes: Pupils are equal, round, and reactive to light. Conjunctivae and EOM are normal. No scleral icterus.  Neck: Normal range of motion. Neck supple. No JVD present. No thyromegaly present.  Cardiovascular: Normal rate, regular rhythm and intact distal pulses.  Exam reveals no gallop and no friction rub.   No murmur heard. Respiratory: Effort normal. No respiratory distress. She has wheezes. She has no rales.  GI: Soft. Bowel sounds are  normal. She exhibits no distension. There is no tenderness.  Musculoskeletal: Normal range of motion. She exhibits no edema.  No arthritis, no gout  Lymphadenopathy:    She has no cervical adenopathy.  Neurological: She is alert and oriented to person, place, and time. No cranial nerve deficit.  No dysarthria, no aphasia  Skin: Skin is warm and dry. No rash noted. No erythema.  Psychiatric: She has a normal mood and affect. Her behavior is normal. Judgment and thought content normal.    LABORATORY PANEL:   CBC  Recent Labs Lab 06/29/17 2007  WBC 8.8  HGB 14.0  HCT 41.7  PLT 313   ------------------------------------------------------------------------------------------------------------------  Chemistries   Recent Labs Lab 06/29/17 2007  NA 139  K 4.3  CL 102  CO2 28  GLUCOSE 104*  BUN 12  CREATININE 0.58  CALCIUM 9.7  AST 37  ALT 32  ALKPHOS 71  BILITOT 0.6   ------------------------------------------------------------------------------------------------------------------  Cardiac Enzymes  Recent Labs Lab 06/29/17 2007  TROPONINI <0.03   ------------------------------------------------------------------------------------------------------------------  RADIOLOGY:  Dg Chest Portable 1  View  Result Date: 06/29/2017 CLINICAL DATA:  56 y/o  F; shortness of breath and chest pain. EXAM: PORTABLE CHEST 1 VIEW COMPARISON:  05/05/2017 chest radiograph FINDINGS: Chronic right posterolateral rib fractures. Streaky opacities in the lung bases probably represent minor atelectasis. No consolidation, effusion, or pneumothorax. Stable normal cardiac silhouette. IMPRESSION: Streaky opacities in the lung bases, probably minor atelectasis. Stable normal cardiac silhouette. Electronically Signed   By: Mitzi Hansen M.D.   On: 06/29/2017 20:28    EKG:   Orders placed or performed during the hospital encounter of 06/29/17  . ED EKG  . ED EKG  . EKG 12-Lead  .  EKG 12-Lead  . EKG 12-Lead  . EKG 12-Lead    IMPRESSION AND PLAN:  Principal Problem:   COPD with acute exacerbation (HCC) - IV Solu-Medrol, duo nebs, azithromycin, when necessary antitussive, continue home meds  All the records are reviewed and case discussed with ED provider. Management plans discussed with the patient and/or family.  DVT PROPHYLAXIS: SubQ lovenox  GI PROPHYLAXIS: None  ADMISSION STATUS: Inpatient  CODE STATUS: Full Code Status History    Date Active Date Inactive Code Status Order ID Comments User Context   05/03/2017  3:14 AM 05/06/2017  6:18 PM Full Code [id number]  Ihor Austin, MD Inpatient   05/03/2017  3:14 AM 05/03/2017  3:14 AM Full Code [id number]  Ricarda Frame, MD Inpatient      TOTAL TIME TAKING CARE OF THIS PATIENT: 45 minutes.   Colonel Krauser FIELDING 06/29/2017, 10:15 PM  [employer]  [phone]  CC: Primary care physician; Cain Sieve, MD  Note:  This document was prepared using Dragon voice recognition software and may include unintentional dictation errors.

## 2017-06-29 NOTE — ED Triage Notes (Signed)
Pt arrived POV with SOB and pain in chest. Pt stated she had a chest tube placed on June 25th in the left side. Family at the bedside. Pt took 2 extra strength Tylenol every 6 hrs for pain. Pt also took an Albuterol treatment today. Pt took 2 Atrivant.

## 2017-06-30 ENCOUNTER — Encounter: Payer: Self-pay | Admitting: Internal Medicine

## 2017-06-30 LAB — CBC
HEMATOCRIT: 40.9 % (ref 35.0–47.0)
Hemoglobin: 14 g/dL (ref 12.0–16.0)
MCH: 33.2 pg (ref 26.0–34.0)
MCHC: 34.3 g/dL (ref 32.0–36.0)
MCV: 96.5 fL (ref 80.0–100.0)
Platelets: 161 10*3/uL (ref 150–440)
RBC: 4.23 MIL/uL (ref 3.80–5.20)
RDW: 14.1 % (ref 11.5–14.5)
WBC: 9.4 10*3/uL (ref 3.6–11.0)

## 2017-06-30 LAB — BASIC METABOLIC PANEL
ANION GAP: 10 (ref 5–15)
BUN: 12 mg/dL (ref 6–20)
CO2: 25 mmol/L (ref 22–32)
Calcium: 9.6 mg/dL (ref 8.9–10.3)
Chloride: 101 mmol/L (ref 101–111)
Creatinine, Ser: 0.74 mg/dL (ref 0.44–1.00)
GFR calc Af Amer: >60 mL/min (ref >60)
Glucose, Bld: 228 mg/dL — ABNORMAL HIGH (ref 65–99)
POTASSIUM: 4.6 mmol/L (ref 3.5–5.1)
SODIUM: 136 mmol/L (ref 135–145)

## 2017-06-30 LAB — HEMOGLOBIN A1C
HEMOGLOBIN A1C: 5.4 % (ref 4.8–5.6)
MEAN PLASMA GLUCOSE: 108.28 mg/dL

## 2017-06-30 LAB — GLUCOSE, CAPILLARY
GLUCOSE-CAPILLARY: 175 mg/dL — AB (ref 65–99)
Glucose-Capillary: 193 mg/dL — ABNORMAL HIGH (ref 65–99)

## 2017-06-30 MED ORDER — PREDNISONE 50 MG PO TABS
50.0000 mg | ORAL_TABLET | Freq: Every day | ORAL | Status: DC
  Administered 2017-07-01: 50 mg via ORAL
  Filled 2017-06-30: qty 1

## 2017-06-30 MED ORDER — ALBUTEROL SULFATE (2.5 MG/3ML) 0.083% IN NEBU: 2.5000 mg | INHALATION_SOLUTION | RESPIRATORY_TRACT | Status: DC | PRN

## 2017-06-30 MED ORDER — INSULIN ASPART 100 UNIT/ML ~~LOC~~ SOLN
0.0000 [IU] | Freq: Three times a day (TID) | SUBCUTANEOUS | Status: DC
Start: 2017-06-30 — End: 2017-07-01
  Administered 2017-06-30: 2 [IU] via SUBCUTANEOUS
  Filled 2017-06-30: qty 1

## 2017-06-30 MED ORDER — IPRATROPIUM-ALBUTEROL 0.5-2.5 (3) MG/3ML IN SOLN
3.0000 mL | Freq: Four times a day (QID) | RESPIRATORY_TRACT | Status: DC
  Administered 2017-06-30 – 2017-07-01 (×5): 3 mL via RESPIRATORY_TRACT
  Filled 2017-06-30 (×5): qty 3

## 2017-06-30 NOTE — Progress Notes (Signed)
Inpatient Diabetes Program Recommendations  AACE/ADA: New Consensus Statement on Inpatient Glycemic Control (2015)  Target Ranges:  Prepandial:   less than 140 mg/dL      Peak postprandial:   less than 180 mg/dL (1-2 hours)      Critically ill patients:  140 - 180 mg/dL   Lab Results  Component Value Date   GLUCAP 106 (H) 05/06/2017   HGBA1C 5.2 05/03/2017    Review of Glycemic Control- lab glucose   Results for SHAWNIE, LOBELL (MRN [id number]) as of 06/30/2017 10:03  Ref. Range 06/29/2017 20:07 06/30/2017 03:22  Glucose Latest Ref Range: 65 - 99 mg/dL 229 (H) 798 (H)    Diabetes history: none noted- A1C pending Outpatient Diabetes medications: none Current orders for Inpatient glycemic control: none  Inpatient Diabetes Program Recommendations:  Consider checking CBG tid/hs and Novolog 0-9 units tid  *steroids 60mg  q6h   Susette Racer, RN, [place], [place], CDE Diabetes Coordinator Inpatient Diabetes Program  [phone] (Team Pager) [phone] [facility] Office) 06/30/2017 10:05 AM

## 2017-06-30 NOTE — Progress Notes (Signed)
[facility] - [place] at [facility]   PATIENT NAME: Renee Cochran    MR#:  [id number]  DATE OF BIRTH:  [birth year]  SUBJECTIVE:  CHIEF COMPLAINT:   Chief Complaint  Patient presents with  . Shortness of Breath   Still has SOB B/L lower chest pain with coughing  REVIEW OF SYSTEMS:    Review of Systems  Constitutional: Positive for malaise/fatigue. Negative for chills and fever.  HENT: Negative for sore throat.   Eyes: Negative for blurred vision, double vision and pain.  Respiratory: Positive for cough, sputum production, shortness of breath and wheezing. Negative for hemoptysis.   Cardiovascular: Positive for chest pain. Negative for palpitations, orthopnea and leg swelling.  Gastrointestinal: Negative for abdominal pain, constipation, diarrhea, heartburn, nausea and vomiting.  Genitourinary: Negative for dysuria and hematuria.  Musculoskeletal: Negative for back pain and joint pain.  Skin: Negative for rash.  Neurological: Positive for weakness. Negative for sensory change, speech change, focal weakness and headaches.  Endo/Heme/Allergies: Does not bruise/bleed easily.  Psychiatric/Behavioral: Negative for depression. The patient is not nervous/anxious.     DRUG ALLERGIES:   Allergies  Allergen Reactions  . Basil Oil Anaphylaxis  . Garlic Anaphylaxis and Nausea And Vomiting  . Onion Anaphylaxis  . Penicillins Anaphylaxis    Has patient had a PCN reaction causing immediate rash, facial/tongue/throat swelling, SOB or lightheadedness with hypotension: yes Has patient had a PCN reaction causing severe rash involving mucus membranes or skin necrosis: yes Has patient had a PCN reaction that required hospitalization yes Has patient had a PCN reaction occurring within the last 10 years: no If all of the above answers are "NO", then may proceed with Cephalosporin use.   . Sulfa Antibiotics Anaphylaxis    VITALS:  Blood pressure (!) 148/82, pulse (!) 108,  temperature 97.8 F (36.6 C), temperature source Oral, resp. rate 18, height 5\' 2"  (1.575 m), weight 68.7 kg (151 lb 6.4 oz), SpO2 94 %.  PHYSICAL EXAMINATION:   Physical Exam  GENERAL:  56 y.o.-year-old patient lying in the bed with no acute distress.  EYES: Pupils equal, round, reactive to light and accommodation. No scleral icterus. Extraocular muscles intact.  HEENT: Head atraumatic, normocephalic. Oropharynx and nasopharynx clear.  NECK:  Supple, no jugular venous distention. No thyroid enlargement, no tenderness.  LUNGS: Decreased air entry b/l. No wheezing. CARDIOVASCULAR: S1, S2 normal. No murmurs, rubs, or gallops.  ABDOMEN: Soft, nontender, nondistended. Bowel sounds present. No organomegaly or mass.  EXTREMITIES: No cyanosis, clubbing or edema b/l.    NEUROLOGIC: Cranial nerves II through XII are intact. No focal Motor or sensory deficits b/l.   PSYCHIATRIC: The patient is alert and oriented x 3.  SKIN: No obvious rash, lesion, or ulcer.   LABORATORY PANEL:   CBC  Recent Labs Lab 06/30/17 0322  WBC 9.4  HGB 14.0  HCT 40.9  PLT 161   ------------------------------------------------------------------------------------------------------------------ Chemistries   Recent Labs Lab 06/29/17 2007 06/30/17 0322  NA 139 136  K 4.3 4.6  CL 102 101  CO2 28 25  GLUCOSE 104* 228*  BUN 12 12  CREATININE 0.58 0.74  CALCIUM 9.7 9.6  AST 37  --   ALT 32  --   ALKPHOS 71  --   BILITOT 0.6  --    ------------------------------------------------------------------------------------------------------------------  Cardiac Enzymes  Recent Labs Lab 06/29/17 2007  TROPONINI <0.03   ------------------------------------------------------------------------------------------------------------------  RADIOLOGY:  Dg Chest Portable 1 View  Result Date: 06/29/2017 CLINICAL DATA:  56 y/o  F;  shortness of breath and chest pain. EXAM: PORTABLE CHEST 1 VIEW COMPARISON:   05/05/2017 chest radiograph FINDINGS: Chronic right posterolateral rib fractures. Streaky opacities in the lung bases probably represent minor atelectasis. No consolidation, effusion, or pneumothorax. Stable normal cardiac silhouette. IMPRESSION: Streaky opacities in the lung bases, probably minor atelectasis. Stable normal cardiac silhouette. Electronically Signed   By: Mitzi Hansen M.D.   On: 06/29/2017 20:28     ASSESSMENT AND PLAN:   * Acute COPD exacerbation with pleuritic chest pin -musckuloskeletal -IV steroids, Antibiotics - Scheduled Nebulizers - Inhalers -Wean O2 as tolerated - Consult pulmonary if no improvement  * Hyperglycemia- likely from steroids Will check Hb A1c SSI if consistently elevated  * DVT prophylaxis Lovenox  All the records are reviewed and case discussed with Care Management/Social Worker Management plans discussed with the patient, family and they are in agreement.  CODE STATUS: FULL CODE  DVT Prophylaxis: SCDs  TOTAL TIME TAKING CARE OF THIS PATIENT: 30 minutes.   POSSIBLE D/C IN 1-2 DAYS, DEPENDING ON CLINICAL CONDITION.  Milagros Loll R M.D on 06/30/2017 at 9:33 AM  Between 7am to 6pm - Pager - [phone]  After 6pm go to [email or web address] - password [email or web address]  [facility]  Office  [phone]  CC: Primary care physician; Cain Sieve, MD  Note: This dictation was prepared with Dragon dictation along with smaller phrase technology. Any transcriptional errors that result from this process are unintentional.

## 2017-07-01 LAB — GLUCOSE, CAPILLARY: GLUCOSE-CAPILLARY: 98 mg/dL (ref 65–99)

## 2017-07-01 MED ORDER — AZITHROMYCIN 250 MG PO TABS: 250.0000 mg | ORAL_TABLET | Freq: Every day | ORAL | 0 refills | Status: DC

## 2017-07-01 MED ORDER — PREDNISONE 10 MG (21) PO TBPK: ORAL_TABLET | ORAL | 0 refills | Status: DC

## 2017-07-05 NOTE — Discharge Summary (Signed)
[facility] - [place] at [facility]   PATIENT NAME: Renee Cochran    MR#:  [id number]  DATE OF BIRTH:  06/06/1961  DATE OF ADMISSION:  06/29/2017 ADMITTING PHYSICIAN: Oralia Manis, MD  DATE OF DISCHARGE: 07/01/2017 11:10 AM  PRIMARY CARE PHYSICIAN: Cain Sieve, MD   ADMISSION DIAGNOSIS:  COPD exacerbation (HCC) [J44.1]  DISCHARGE DIAGNOSIS:  Principal Problem:   COPD with acute exacerbation (HCC)   SECONDARY DIAGNOSIS:   Past Medical History:  Diagnosis Date  . Asthma   . COPD (chronic obstructive pulmonary disease) (HCC)      ADMITTING HISTORY  HISTORY OF PRESENT ILLNESS:  Renee Cochran  is a 56 y.o. female who presents with Progressive shortness of breath and increased wheezing for the past 3 days. She has also had an increased sputum. Here in the ED she did not have any pneumonia seen on chest x-ray. No other clear signs of infection, patient denies any other infectious symptoms. She was treated for COPD exacerbation with some improvement in the ED, but still has significant wheezing and air flow limitation. Hospitalists were called for admission.   HOSPITAL COURSE:   * Acute COPD exacerbation with pleuritic chest pain -musckuloskeletal -IV steroids, Antibiotics. Changed to oral prednisone and azithromycin at discharge - Scheduled Nebulizers - Inhalers - Home oxygen set up at discharge. No pneumonia on chest x-ray  * Hyperglycemia- likely from steroids Hemoglobin A1c and normal  * DVT prophylaxis Lovenox  Stable for discharge home.  CONSULTS OBTAINED:    DRUG ALLERGIES:   Allergies  Allergen Reactions  . Basil Oil Anaphylaxis  . Garlic Anaphylaxis and Nausea And Vomiting  . Onion Anaphylaxis  . Penicillins Anaphylaxis    Has patient had a PCN reaction causing immediate rash, facial/tongue/throat swelling, SOB or lightheadedness with hypotension: yes Has patient had a PCN reaction causing severe rash involving mucus  membranes or skin necrosis: yes Has patient had a PCN reaction that required hospitalization yes Has patient had a PCN reaction occurring within the last 10 years: no If all of the above answers are "NO", then may proceed with Cephalosporin use.   . Sulfa Antibiotics Anaphylaxis    DISCHARGE MEDICATIONS:   Discharge Medication List as of 07/01/2017  9:00 AM    CONTINUE these medications which have CHANGED   Details  azithromycin (ZITHROMAX) 250 MG tablet Take 1 tablet (250 mg total) by mouth daily., Starting Fri 07/01/2017, Normal    predniSONE (STERAPRED UNI-PAK 21 TAB) 10 MG (21) TBPK tablet Taper by 10 mg daily, Normal      CONTINUE these medications which have NOT CHANGED   Details  albuterol (PROVENTIL HFA;VENTOLIN HFA) 108 (90 Base) MCG/ACT inhaler Inhale 2 puffs into the lungs every 4 (four) hours as needed., Starting Fri 04/16/2016, Historical Med    albuterol (PROVENTIL) (2.5 MG/3ML) 0.083% nebulizer solution Take 2.5 mg by nebulization every 4 (four) hours as needed for wheezing or shortness of breath., Historical Med    fluticasone (FLONASE) 50 MCG/ACT nasal spray Place 1 spray into both nostrils daily., Starting Sat 05/07/2017, Normal    ipratropium (ATROVENT) 0.02 % nebulizer solution Take 1 mcg by nebulization 3 (three) times daily. , Starting Thu 10/14/2016, Historical Med    montelukast (SINGULAIR) 10 MG tablet Take 1 tablet by mouth at bedtime., Starting Mon 09/27/2016, Historical Med    tiotropium (SPIRIVA) 18 MCG inhalation capsule Place 18 mcg into inhaler and inhale daily., Historical Med    Diclofenac Sodium 3 % GEL Place 1  application onto the skin every 12 (twelve) hours as needed (pain)., Starting Mon 10/25/2016, Print    fluticasone furoate-vilanterol (BREO ELLIPTA) 100-25 MCG/INH AEPB Inhale 1 puff into the lungs daily., Starting Fri 05/06/2017, Print        Today   VITAL SIGNS:  Blood pressure 134/78, pulse 95, temperature 98 F (36.7 C), temperature  source Oral, resp. rate 20, height 5\' 2"  (1.575 m), weight 68.7 kg (151 lb 6.4 oz), SpO2 97 %.  I/O:  No intake or output data in the 24 hours ending 07/05/17 1411  PHYSICAL EXAMINATION:  Physical Exam  GENERAL:  56 y.o.-year-old patient lying in the bed with no acute distress.  LUNGS: Normal breath sounds bilaterally, no wheezing, rales,rhonchi or crepitation. No use of accessory muscles of respiration.  CARDIOVASCULAR: S1, S2 normal. No murmurs, rubs, or gallops.  ABDOMEN: Soft, non-tender, non-distended. Bowel sounds present. No organomegaly or mass.  NEUROLOGIC: Moves all 4 extremities. PSYCHIATRIC: The patient is alert and oriented x 3.  SKIN: No obvious rash, lesion, or ulcer.   DATA REVIEW:   CBC  Recent Labs Lab 06/30/17 0322  WBC 9.4  HGB 14.0  HCT 40.9  PLT 161    Chemistries   Recent Labs Lab 06/29/17 2007 06/30/17 0322  NA 139 136  K 4.3 4.6  CL 102 101  CO2 28 25  GLUCOSE 104* 228*  BUN 12 12  CREATININE 0.58 0.74  CALCIUM 9.7 9.6  AST 37  --   ALT 32  --   ALKPHOS 71  --   BILITOT 0.6  --     Cardiac Enzymes  Recent Labs Lab 06/29/17 2007  TROPONINI <0.03    Microbiology Results  Results for orders placed or performed during the hospital encounter of 05/03/17  MRSA PCR Screening     Status: None   Collection Time: 05/03/17  2:10 AM  Result Value Ref Range Status   MRSA by PCR NEGATIVE NEGATIVE Final    Comment:        The GeneXpert MRSA Assay (FDA approved for NASAL specimens only), is one component of a comprehensive MRSA colonization surveillance program. It is not intended to diagnose MRSA infection nor to guide or monitor treatment for MRSA infections.   Culture, expectorated sputum-assessment     Status: None   Collection Time: 05/05/17 11:12 AM  Result Value Ref Range Status   Specimen Description EXPECTORATED SPUTUM  Final   Special Requests NONE  Final   Sputum evaluation THIS SPECIMEN IS ACCEPTABLE FOR SPUTUM  CULTURE  Final   Report Status 05/05/2017 FINAL  Final  Culture, respiratory (NON-Expectorated)     Status: Abnormal   Collection Time: 05/05/17 11:12 AM  Result Value Ref Range Status   Specimen Description EXPECTORATED SPUTUM  Final   Special Requests NONE Reflexed from [id number]  Final   Gram Stain   Final    ABUNDANT WBC PRESENT, PREDOMINANTLY PMN ABUNDANT GRAM POSITIVE RODS RARE GRAM POSITIVE COCCI IN PAIRS RARE GRAM NEGATIVE RODS RARE YEAST Performed at [facility], [address], [place], [place] [address]    Culture MULTIPLE ORGANISMS PRESENT, NONE PREDOMINANT (A)  Final   Report Status 05/07/2017 FINAL  Final    RADIOLOGY:  No results found.  Follow up with PCP in 1 week.  Management plans discussed with the patient, family and they are in agreement.  CODE STATUS:  Code Status History    Date Active Date Inactive Code Status Order ID Comments User  Context   06/29/2017 11:06 PM 07/01/2017  2:16 PM Full Code [id number]  Oralia Manis, MD Inpatient   05/03/2017  3:14 AM 05/06/2017  6:18 PM Full Code [id number]  Ihor Austin, MD Inpatient   05/03/2017  3:14 AM 05/03/2017  3:14 AM Full Code [id number]  Ricarda Frame, MD Inpatient      TOTAL TIME TAKING CARE OF THIS PATIENT ON DAY OF DISCHARGE: more than 30 minutes.   Milagros Loll R M.D on 07/05/2017 at 2:11 PM  Between 7am to 6pm - Pager - [phone]  After 6pm go to [email or web address] - password [email or web address]  [facility] [place] [facility]  Office  [phone]  CC: Primary care physician; Cain Sieve, MD  Note: This dictation was prepared with Dragon dictation along with smaller phrase technology. Any transcriptional errors that result from this process are unintentional.

## 2017-09-23 ENCOUNTER — Emergency Department: Payer: Medicare Other

## 2017-09-23 ENCOUNTER — Inpatient Hospital Stay
Admission: EM | Admit: 2017-09-23 | Discharge: 2017-09-27 | DRG: 389 | Disposition: A | Discharge status: Home / Self Care | Payer: Medicare Other | Attending: Internal Medicine | Admitting: Internal Medicine

## 2017-09-23 DIAGNOSIS — M4854XA Collapsed vertebra, not elsewhere classified, thoracic region, initial encounter for fracture: Secondary | ICD-10-CM | POA: Diagnosis present

## 2017-09-23 DIAGNOSIS — Z66 Do not resuscitate: Secondary | ICD-10-CM | POA: Diagnosis not present

## 2017-09-23 DIAGNOSIS — J449 Chronic obstructive pulmonary disease, unspecified: Secondary | ICD-10-CM | POA: Diagnosis present

## 2017-09-23 DIAGNOSIS — K5651 Intestinal adhesions [bands], with partial obstruction: Secondary | ICD-10-CM | POA: Diagnosis not present

## 2017-09-23 DIAGNOSIS — R079 Chest pain, unspecified: Secondary | ICD-10-CM

## 2017-09-23 DIAGNOSIS — Z880 Allergy status to penicillin: Secondary | ICD-10-CM

## 2017-09-23 DIAGNOSIS — R1013 Epigastric pain: Secondary | ICD-10-CM | POA: Diagnosis not present

## 2017-09-23 DIAGNOSIS — Z882 Allergy status to sulfonamides status: Secondary | ICD-10-CM

## 2017-09-23 DIAGNOSIS — K458 Other specified abdominal hernia without obstruction or gangrene: Secondary | ICD-10-CM

## 2017-09-23 DIAGNOSIS — Z91018 Allergy to other foods: Secondary | ICD-10-CM

## 2017-09-23 DIAGNOSIS — Z800 Family history of malignant neoplasm of digestive organs: Secondary | ICD-10-CM

## 2017-09-23 DIAGNOSIS — F17210 Nicotine dependence, cigarettes, uncomplicated: Secondary | ICD-10-CM | POA: Diagnosis present

## 2017-09-23 DIAGNOSIS — K56609 Unspecified intestinal obstruction, unspecified as to partial versus complete obstruction: Secondary | ICD-10-CM

## 2017-09-23 DIAGNOSIS — E876 Hypokalemia: Secondary | ICD-10-CM | POA: Diagnosis not present

## 2017-09-23 DIAGNOSIS — Z7951 Long term (current) use of inhaled steroids: Secondary | ICD-10-CM

## 2017-09-23 DIAGNOSIS — Z9981 Dependence on supplemental oxygen: Secondary | ICD-10-CM

## 2017-09-23 HISTORY — DX: Atelectasis: J98.11

## 2017-09-23 LAB — BASIC METABOLIC PANEL
ANION GAP: 10 (ref 5–15)
BUN: 14 mg/dL (ref 6–20)
CALCIUM: 9.3 mg/dL (ref 8.9–10.3)
CO2: 26 mmol/L (ref 22–32)
Chloride: 102 mmol/L (ref 101–111)
Creatinine, Ser: 0.5 mg/dL (ref 0.44–1.00)
GFR calc Af Amer: >60 mL/min (ref >60)
GFR calc non Af Amer: >60 mL/min (ref >60)
GLUCOSE: 121 mg/dL — AB (ref 65–99)
Potassium: 3.6 mmol/L (ref 3.5–5.1)
Sodium: 138 mmol/L (ref 135–145)

## 2017-09-23 LAB — TROPONIN I

## 2017-09-23 LAB — CBC
HEMATOCRIT: 39.5 % (ref 35.0–47.0)
HEMOGLOBIN: 13.3 g/dL (ref 12.0–16.0)
MCH: 33.2 pg (ref 26.0–34.0)
MCHC: 33.7 g/dL (ref 32.0–36.0)
MCV: 98.6 fL (ref 80.0–100.0)
Platelets: 318 10*3/uL (ref 150–440)
RBC: 4.01 MIL/uL (ref 3.80–5.20)
RDW: 15.1 % — ABNORMAL HIGH (ref 11.5–14.5)
WBC: 7.6 10*3/uL (ref 3.6–11.0)

## 2017-09-23 LAB — HEPATIC FUNCTION PANEL
ALK PHOS: 77 U/L (ref 38–126)
ALT: 20 U/L (ref 14–54)
AST: 23 U/L (ref 15–41)
Albumin: 3.8 g/dL (ref 3.5–5.0)
BILIRUBIN DIRECT: 0.1 mg/dL (ref 0.1–0.5)
BILIRUBIN INDIRECT: 0.5 mg/dL (ref 0.3–0.9)
BILIRUBIN TOTAL: 0.6 mg/dL (ref 0.3–1.2)
Total Protein: 6.6 g/dL (ref 6.5–8.1)

## 2017-09-23 LAB — LIPASE, BLOOD: Lipase: 21 U/L (ref 11–51)

## 2017-09-23 MED ORDER — BENZOCAINE 20 % MT SOLN
OROMUCOSAL | Status: AC
  Filled 2017-09-23: qty 5

## 2017-09-23 MED ORDER — IPRATROPIUM-ALBUTEROL 0.5-2.5 (3) MG/3ML IN SOLN
3.0000 mL | Freq: Once | RESPIRATORY_TRACT | Status: AC
  Administered 2017-09-23: 3 mL via RESPIRATORY_TRACT

## 2017-09-23 MED ORDER — KETOROLAC TROMETHAMINE 30 MG/ML IJ SOLN
15.0000 mg | Freq: Once | INTRAMUSCULAR | Status: AC
  Administered 2017-09-23: 15 mg via INTRAVENOUS
  Filled 2017-09-23: qty 1

## 2017-09-23 MED ORDER — IPRATROPIUM-ALBUTEROL 0.5-2.5 (3) MG/3ML IN SOLN
RESPIRATORY_TRACT | Status: AC
  Filled 2017-09-23: qty 9

## 2017-09-23 MED ORDER — IPRATROPIUM-ALBUTEROL 0.5-2.5 (3) MG/3ML IN SOLN
RESPIRATORY_TRACT | Status: AC
  Administered 2017-09-23: 3 mL via RESPIRATORY_TRACT
  Filled 2017-09-23: qty 3

## 2017-09-23 MED ORDER — IOPAMIDOL (ISOVUE-300) INJECTION 61%
100.0000 mL | Freq: Once | INTRAVENOUS | Status: AC | PRN
  Administered 2017-09-23: 100 mL via INTRAVENOUS

## 2017-09-23 MED ORDER — LIDOCAINE HCL 2 % EX GEL
1.0000 "application " | Freq: Once | CUTANEOUS | Status: AC
  Administered 2017-09-23: 1 via TOPICAL
  Filled 2017-09-23: qty 5

## 2017-09-23 NOTE — ED Triage Notes (Signed)
Patient c/o generalized chest pain radiating to back, and SOB

## 2017-09-23 NOTE — ED Notes (Signed)
Patient transported to CT 

## 2017-09-23 NOTE — ED Provider Notes (Signed)
[facility] Emergency Department Provider Note  ____________________________________________   First MD Initiated Contact with Patient 09/23/17 2106     (approximate)  I have reviewed the triage vital signs and the nursing notes.   HISTORY  Chief Complaint Chest Pain    HPI Renee Cochran is a 56 y.o. female who comes to the emergency department with gradual onset upper abdominal discomfort that began this morning.  The pain has been insidious in onset slowly progressive.  Nothing seems to make it better or worse.  The pain is in her epigastric area wrapping around her left and right abdomen to her back.  It is not ripping or tearing.  She denies shortness of breath.  She is concerned that she might have a pneumothorax because she has a history of COPD and is previously had a pneumothorax with requiring a chest tube.  She has had a cesarean section in the past but no other abdominal surgeries.  Past Medical History:  Diagnosis Date  . Asthma   . Collapse of left lung   . COPD (chronic obstructive pulmonary disease) [facility](HCC)     Patient Active Problem List   Diagnosis Date Noted  . COPD with acute exacerbation (HCC) 06/29/2017  . Pneumothorax on left 05/03/2017  . Respiratory failure (HCC) 05/03/2017    Past Surgical History:  Procedure Laterality Date  . CESAREAN SECTION    . CHEST TUBE INSERTION      Prior to Admission medications   Medication Sig Start Date End Date Taking? Authorizing Provider  albuterol (PROVENTIL HFA;VENTOLIN HFA) 108 (90 Base) MCG/ACT inhaler Inhale 2 puffs into the lungs every 4 (four) hours as needed. 04/16/16   [provider]  albuterol (PROVENTIL) (2.5 MG/3ML) 0.083% nebulizer solution Take 2.5 mg by nebulization every 4 (four) hours as needed for wheezing or shortness of breath.    [provider]  azithromycin (ZITHROMAX) 250 MG tablet Take 1 tablet (250 mg total) by mouth daily. 07/01/17   Milagros LollSudini, Srikar,  MD  Diclofenac Sodium 3 % GEL Place 1 application onto the skin every 12 (twelve) hours as needed (pain). Patient not taking: Reported on 05/04/2017 10/25/16   Myrna BlazerSchaevitz, David Matthew, MD  fluticasone [facility](FLONASE) 50 MCG/ACT nasal spray Place 1 spray into both nostrils daily. 05/07/17   Katha HammingKonidena, Snehalatha, MD  fluticasone furoate-vilanterol (BREO ELLIPTA) 100-25 MCG/INH AEPB Inhale 1 puff into the lungs daily. Patient not taking: Reported on 06/30/2017 05/06/17   Katha HammingKonidena, Snehalatha, MD  ipratropium (ATROVENT) 0.02 % nebulizer solution Take 1 mcg by nebulization 3 (three) times daily.  10/14/16   [provider]  montelukast (SINGULAIR) 10 MG tablet Take 1 tablet by mouth at bedtime. 09/27/16   [provider]  predniSONE (STERAPRED UNI-PAK 21 TAB) 10 MG (21) TBPK tablet Taper by 10 mg daily 07/01/17   Milagros LollSudini, Srikar, MD  tiotropium (SPIRIVA) 18 MCG inhalation capsule Place 18 mcg into inhaler and inhale daily.    [provider]    Allergies Basil oil; Garlic; Onion; Penicillins; and Sulfa antibiotics  Family History  Problem Relation Age of Onset  . Colon cancer Father   . Colon cancer Paternal Grandfather   . Diabetes Neg Hx   . Hypertension Neg Hx     Social History Social History   Tobacco Use  . Smoking status: Current Some Day Smoker    Packs/day: 0.50    Types: Cigarettes  . Smokeless tobacco: Never Used  Substance Use Topics  . Alcohol use:  No  . Drug use: No    Review of Systems Constitutional: No fever/chills Eyes: No visual changes. ENT: No sore throat. Cardiovascular: Denies chest pain. Respiratory: Positive for shortness of breath. Gastrointestinal: Positive for abdominal pain.  Positive for nausea, no vomiting.  No diarrhea.  No constipation. Genitourinary: Negative for dysuria. Musculoskeletal: Negative for back pain. Skin: Negative for rash. Neurological: Negative for headaches, focal weakness or  numbness.   ____________________________________________   PHYSICAL EXAM:  VITAL SIGNS: ED Triage Vitals  Enc Vitals Group     BP 09/23/17 2053 (!) 166/76     Pulse Rate 09/23/17 2053 100     Resp 09/23/17 2053 (!) 25     Temp 09/23/17 2053 97.9 F (36.6 C)     Temp Source 09/23/17 2053 Oral     SpO2 09/23/17 2053 98 %     Weight 09/23/17 2053 150 lb (68 kg)     Height --      Head Circumference --      Peak Flow --      Pain Score 09/23/17 2052 8     Pain Loc --      Pain Edu? --      Excl. in GC? --     Constitutional: Alert and oriented x4 appears somewhat uncomfortable nontoxic no diaphoresis speaks in full clear sentences Eyes: PERRL EOMI. Head: Atraumatic. Nose: No congestion/rhinnorhea. Mouth/Throat: No trismus Neck: No stridor.   Cardiovascular: Tachycardic rate, regular rhythm. Grossly normal heart sounds.  Good peripheral circulation. Respiratory: Normal respiratory effort.  No retractions. Lungs CTAB and moving good air Gastrointestinal: Soft abdomen diffuse mild tenderness with no rebound no guarding and no frank peritonitis no focality Musculoskeletal: No lower extremity edema   Neurologic:  Normal speech and language. No gross focal neurologic deficits are appreciated. Skin:  Skin is warm, dry and intact. No rash noted. Psychiatric: Mood and affect are normal. Speech and behavior are normal.    ____________________________________________   DIFFERENTIAL includes but not limited to  Appendicitis, pancreatitis, diverticulitis, cholecystitis, bowel obstruction ____________________________________________   LABS (all labs ordered are listed, but only abnormal results are displayed)  Labs Reviewed  BASIC METABOLIC PANEL - Abnormal; Notable for the following components:      Result Value   Glucose, Bld 121 (*)    All other components within normal limits  CBC - Abnormal; Notable for the following components:   RDW 15.1 (*)    All other components  within normal limits  TROPONIN I  HEPATIC FUNCTION PANEL  LIPASE, BLOOD  URINALYSIS, COMPLETE (UACMP) WITH MICROSCOPIC    Blood work reviewed and interpreted by me with no acute disease __________________________________________  EKG  ED ECG REPORT I, Merrily Brittle, the attending physician, personally viewed and interpreted this ECG.  Date: 09/23/2017 EKG Time:  Rate: 100 Rhythm: normal sinus rhythm QRS Axis: normal Intervals: normal ST/T Wave abnormalities: normal Narrative Interpretation: no evidence of acute ischemia  ____________________________________________  RADIOLOGY  Chest x-ray reviewed by me with chronic changes but no acute disease CT abdomen pelvis reviewed by me with evidence of small bowel obstruction with internal hernia ____________________________________________   PROCEDURES  Procedure(s) performed: no  Procedures  Critical Care performed: yes  CRITICAL CARE Performed by: Merrily Brittle   Total critical care time: 35 minutes  Critical care time was exclusive of separately billable procedures and treating other patients.  Critical care was necessary to treat or prevent imminent or life-threatening deterioration.  Critical care was time spent personally  by me on the following activities: development of treatment plan with patient and/or surrogate as well as nursing, discussions with consultants, evaluation of patient's response to treatment, examination of patient, obtaining history from patient or surrogate, ordering and performing treatments and interventions, ordering and review of laboratory studies, ordering and review of radiographic studies, pulse oximetry and re-evaluation of patient's condition.   Observation: no ____________________________________________   INITIAL IMPRESSION / ASSESSMENT AND PLAN / ED COURSE  Pertinent labs & imaging results that were available during my care of the patient were reviewed by me and considered  in my medical decision making (see chart for details).  Prior to my evaluation the patient had received several nebulization treatments although when I arrived in the room her lungs were completely clear.  She said that she is familiar with her COPD and this does not feel like her typical attack.  I have added on hepatic function as well as lipase because her pain wrapping around could very well be pancreatitis.    ----------------------------------------- 10:39 PM on 09/23/2017 -----------------------------------------  The patient had clear lungs with unremarkable labs although significant abdominal discomfort so I obtained a CT scan abdomen pelvis which is concerning for small bowel obstruction with internal hernia.  I have consulted general surgeon Dr. Earlene Plateravis for evaluation and he recommends NG tube placement.  At this point patient will require inpatient admission for IV hydration and serial exams and possible surgical intervention. ____________________________________________   FINAL CLINICAL IMPRESSION(S) / ED DIAGNOSES  Final diagnoses:  Small bowel obstruction (HCC)  Internal hernia      NEW MEDICATIONS STARTED DURING THIS VISIT:  This SmartLink is deprecated. Use AVSMEDLIST instead to display the medication list for a patient.   Note:  This document was prepared using Dragon voice recognition software and may include unintentional dictation errors.     Merrily Brittleifenbark, Anihya Tuma, MD 09/23/17 2240

## 2017-09-24 ENCOUNTER — Encounter: Payer: Self-pay | Admitting: Surgery

## 2017-09-24 DIAGNOSIS — K56609 Unspecified intestinal obstruction, unspecified as to partial versus complete obstruction: Secondary | ICD-10-CM | POA: Diagnosis not present

## 2017-09-24 DIAGNOSIS — Z66 Do not resuscitate: Secondary | ICD-10-CM | POA: Diagnosis not present

## 2017-09-24 DIAGNOSIS — Z800 Family history of malignant neoplasm of digestive organs: Secondary | ICD-10-CM | POA: Diagnosis not present

## 2017-09-24 DIAGNOSIS — R1013 Epigastric pain: Secondary | ICD-10-CM | POA: Diagnosis present

## 2017-09-24 DIAGNOSIS — Z882 Allergy status to sulfonamides status: Secondary | ICD-10-CM | POA: Diagnosis not present

## 2017-09-24 DIAGNOSIS — Z7951 Long term (current) use of inhaled steroids: Secondary | ICD-10-CM | POA: Diagnosis not present

## 2017-09-24 DIAGNOSIS — M4854XA Collapsed vertebra, not elsewhere classified, thoracic region, initial encounter for fracture: Secondary | ICD-10-CM | POA: Diagnosis present

## 2017-09-24 DIAGNOSIS — K5651 Intestinal adhesions [bands], with partial obstruction: Principal | ICD-10-CM

## 2017-09-24 DIAGNOSIS — Z91018 Allergy to other foods: Secondary | ICD-10-CM | POA: Diagnosis not present

## 2017-09-24 DIAGNOSIS — J449 Chronic obstructive pulmonary disease, unspecified: Secondary | ICD-10-CM | POA: Diagnosis present

## 2017-09-24 DIAGNOSIS — E876 Hypokalemia: Secondary | ICD-10-CM | POA: Diagnosis not present

## 2017-09-24 DIAGNOSIS — Z880 Allergy status to penicillin: Secondary | ICD-10-CM | POA: Diagnosis not present

## 2017-09-24 DIAGNOSIS — Z9981 Dependence on supplemental oxygen: Secondary | ICD-10-CM | POA: Diagnosis not present

## 2017-09-24 DIAGNOSIS — F17210 Nicotine dependence, cigarettes, uncomplicated: Secondary | ICD-10-CM | POA: Diagnosis present

## 2017-09-24 LAB — CBC
HCT: 37.7 % (ref 35.0–47.0)
Hemoglobin: 12.6 g/dL (ref 12.0–16.0)
MCH: 32.9 pg (ref 26.0–34.0)
MCHC: 33.4 g/dL (ref 32.0–36.0)
MCV: 98.8 fL (ref 80.0–100.0)
PLATELETS: 289 10*3/uL (ref 150–440)
RBC: 3.82 MIL/uL (ref 3.80–5.20)
RDW: 15 % — ABNORMAL HIGH (ref 11.5–14.5)
WBC: 5.3 10*3/uL (ref 3.6–11.0)

## 2017-09-24 LAB — BASIC METABOLIC PANEL
Anion gap: 9 (ref 5–15)
BUN: 12 mg/dL (ref 6–20)
CALCIUM: 8.9 mg/dL (ref 8.9–10.3)
CHLORIDE: 101 mmol/L (ref 101–111)
CO2: 30 mmol/L (ref 22–32)
CREATININE: 0.56 mg/dL (ref 0.44–1.00)
GFR calc non Af Amer: >60 mL/min (ref >60)
Glucose, Bld: 112 mg/dL — ABNORMAL HIGH (ref 65–99)
Potassium: 3.9 mmol/L (ref 3.5–5.1)
SODIUM: 140 mmol/L (ref 135–145)

## 2017-09-24 MED ORDER — ONDANSETRON HCL 4 MG/2ML IJ SOLN
4.0000 mg | Freq: Four times a day (QID) | INTRAMUSCULAR | Status: DC | PRN
  Administered 2017-09-24 – 2017-09-25 (×3): 4 mg via INTRAVENOUS
  Filled 2017-09-24 (×4): qty 2

## 2017-09-24 MED ORDER — PANTOPRAZOLE SODIUM 40 MG IV SOLR
40.0000 mg | INTRAVENOUS | Status: DC
  Administered 2017-09-24 – 2017-09-26 (×3): 40 mg via INTRAVENOUS
  Filled 2017-09-24 (×3): qty 40

## 2017-09-24 MED ORDER — IPRATROPIUM-ALBUTEROL 0.5-2.5 (3) MG/3ML IN SOLN
3.0000 mL | Freq: Four times a day (QID) | RESPIRATORY_TRACT | Status: DC
  Administered 2017-09-24 (×3): 3 mL via RESPIRATORY_TRACT
  Filled 2017-09-24 (×3): qty 3

## 2017-09-24 MED ORDER — ONDANSETRON HCL 4 MG PO TABS: 4.0000 mg | ORAL_TABLET | Freq: Four times a day (QID) | ORAL | Status: DC | PRN

## 2017-09-24 MED ORDER — BUTALBITAL-APAP-CAFFEINE 50-325-40 MG PO TABS
1.0000 | ORAL_TABLET | Freq: Four times a day (QID) | ORAL | Status: DC | PRN
  Administered 2017-09-24 – 2017-09-25 (×4): 1 via ORAL
  Filled 2017-09-24 (×8): qty 1

## 2017-09-24 MED ORDER — TIOTROPIUM BROMIDE MONOHYDRATE 18 MCG IN CAPS
18.0000 ug | ORAL_CAPSULE | Freq: Every day | RESPIRATORY_TRACT | Status: DC
  Administered 2017-09-24 – 2017-09-27 (×4): 18 ug via RESPIRATORY_TRACT
  Filled 2017-09-24: qty 5

## 2017-09-24 MED ORDER — MONTELUKAST SODIUM 10 MG PO TABS
10.0000 mg | ORAL_TABLET | Freq: Every day | ORAL | Status: DC
  Administered 2017-09-24 – 2017-09-26 (×3): 10 mg via ORAL
  Filled 2017-09-24 (×3): qty 1

## 2017-09-24 MED ORDER — ACETAMINOPHEN 325 MG PO TABS
650.0000 mg | ORAL_TABLET | Freq: Four times a day (QID) | ORAL | Status: DC | PRN
  Administered 2017-09-24 – 2017-09-27 (×7): 650 mg via ORAL
  Filled 2017-09-24 (×7): qty 2

## 2017-09-24 MED ORDER — ENOXAPARIN SODIUM 40 MG/0.4ML ~~LOC~~ SOLN
40.0000 mg | SUBCUTANEOUS | Status: DC
  Administered 2017-09-24 – 2017-09-26 (×3): 40 mg via SUBCUTANEOUS
  Filled 2017-09-24 (×3): qty 0.4

## 2017-09-24 MED ORDER — SODIUM CHLORIDE 0.9 % IV SOLN
INTRAVENOUS | Status: DC
  Administered 2017-09-24 – 2017-09-25 (×3): via INTRAVENOUS

## 2017-09-24 NOTE — H&P (Signed)
[facility] [place] at [facility]   PATIENT NAME: Renee Cochran    MR#:  [id number]  DATE OF BIRTH:  1961-08-05  DATE OF ADMISSION:  09/23/2017  PRIMARY CARE PHYSICIAN: Cain SieveKlipstein, Christopher, MD   REQUESTING/REFERRING PHYSICIAN: Manson PasseyBrown, MD  CHIEF COMPLAINT:   Chief Complaint  Patient presents with  . Chest Pain    HISTORY OF PRESENT ILLNESS:  Renee CenterBonnie Deweese  is a 56 y.o. female who presents with epigastric abdominal pain.  Patient has also had nausea without vomiting.  Here in the ED she was found on imaging to have SBO, likely internal hernia.  She has severe copd on 3L O2 at home at baseline and does not want to have surgery.  Hospitalists were called for admission with surgery consulting  PAST MEDICAL HISTORY:   Past Medical History:  Diagnosis Date  . Asthma   . Collapse of left lung   . COPD (chronic obstructive pulmonary disease) (HCC)     PAST SURGICAL HISTORY:   Past Surgical History:  Procedure Laterality Date  . CESAREAN SECTION     x 2  . CHEST TUBE INSERTION    . RECTAL SURGERY     for tumor as a child    SOCIAL HISTORY:   Social History   Tobacco Use  . Smoking status: Current Some Day Smoker    Packs/day: 0.50    Types: Cigarettes  . Smokeless tobacco: Never Used  Substance Use Topics  . Alcohol use: No    FAMILY HISTORY:   Family History  Problem Relation Age of Onset  . Colon cancer Father   . Colon cancer Paternal Grandfather   . Diabetes Neg Hx   . Hypertension Neg Hx     DRUG ALLERGIES:   Allergies  Allergen Reactions  . Basil Oil Anaphylaxis  . Garlic Anaphylaxis and Nausea And Vomiting  . Onion Anaphylaxis  . Penicillins Anaphylaxis    Has patient had a PCN reaction causing immediate rash, facial/tongue/throat swelling, SOB or lightheadedness with hypotension: yes Has patient had a PCN reaction causing severe rash involving mucus membranes or skin necrosis: yes Has patient had a PCN reaction  that required hospitalization yes Has patient had a PCN reaction occurring within the last 10 years: no If all of the above answers are "NO", then may proceed with Cephalosporin use.   . Sulfa Antibiotics Anaphylaxis    MEDICATIONS AT HOME:   Prior to Admission medications   Medication Sig Start Date End Date Taking? Authorizing Provider  albuterol (PROVENTIL HFA;VENTOLIN HFA) 108 (90 Base) MCG/ACT inhaler Inhale 2 puffs into the lungs every 4 (four) hours as needed. 04/16/16   [provider]  albuterol (PROVENTIL) (2.5 MG/3ML) 0.083% nebulizer solution Take 2.5 mg by nebulization every 4 (four) hours as needed for wheezing or shortness of breath.    [provider]  azithromycin (ZITHROMAX) 250 MG tablet Take 1 tablet (250 mg total) by mouth daily. 07/01/17   Milagros LollSudini, Srikar, MD  Diclofenac Sodium 3 % GEL Place 1 application onto the skin every 12 (twelve) hours as needed (pain). Patient not taking: Reported on 05/04/2017 10/25/16   Myrna BlazerSchaevitz, Whittley Carandang Matthew, MD  fluticasone [facility](FLONASE) 50 MCG/ACT nasal spray Place 1 spray into both nostrils daily. 05/07/17   Katha HammingKonidena, Snehalatha, MD  fluticasone furoate-vilanterol (BREO ELLIPTA) 100-25 MCG/INH AEPB Inhale 1 puff into the lungs daily. Patient not taking: Reported on 06/30/2017 05/06/17   Katha HammingKonidena, Snehalatha, MD  ipratropium (ATROVENT) 0.02 % nebulizer solution Take  1 mcg by nebulization 3 (three) times daily.  10/14/16   [provider]  montelukast (SINGULAIR) 10 MG tablet Take 1 tablet by mouth at bedtime. 09/27/16   [provider]  predniSONE (STERAPRED UNI-PAK 21 TAB) 10 MG (21) TBPK tablet Taper by 10 mg daily 07/01/17   Milagros Loll, MD  tiotropium (SPIRIVA) 18 MCG inhalation capsule Place 18 mcg into inhaler and inhale daily.    [provider]    REVIEW OF SYSTEMS:  Review of Systems  Constitutional: Negative for chills, fever, malaise/fatigue and weight loss.  HENT: Negative for ear pain,  hearing loss and tinnitus.   Eyes: Negative for blurred vision, double vision, pain and redness.  Respiratory: Negative for cough, hemoptysis and shortness of breath.   Cardiovascular: Negative for chest pain, palpitations, orthopnea and leg swelling.  Gastrointestinal: Positive for abdominal pain and nausea. Negative for constipation, diarrhea and vomiting.  Genitourinary: Negative for dysuria, frequency and hematuria.  Musculoskeletal: Negative for back pain, joint pain and neck pain.  Skin:       No acne, rash, or lesions  Neurological: Negative for dizziness, tremors, focal weakness and weakness.  Endo/Heme/Allergies: Negative for polydipsia. Does not bruise/bleed easily.  Psychiatric/Behavioral: Negative for depression. The patient is not nervous/anxious and does not have insomnia.      VITAL SIGNS:   Vitals:   09/23/17 2330 09/23/17 2345 09/24/17 0000 09/24/17 0030  BP: (!) 148/83  (!) 148/86 (!) 151/80  Pulse: (!) 110 (!) 123 (!) 102 100  Resp: 16 16 11 15   Temp:      TempSrc:      SpO2: 93% (!) 88% 96% 96%  Weight:       Wt Readings from Last 3 Encounters:  09/23/17 68 kg (150 lb)  06/29/17 68.7 kg (151 lb 6.4 oz)  05/06/17 68.7 kg (151 lb 8 oz)    PHYSICAL EXAMINATION:  Physical Exam  Vitals reviewed. Constitutional: She is oriented to person, place, and time. She appears well-developed and well-nourished. No distress.  HENT:  Head: Normocephalic and atraumatic.  Mouth/Throat: Oropharynx is clear and moist.  Eyes: Conjunctivae and EOM are normal. Pupils are equal, round, and reactive to light. No scleral icterus.  Neck: Normal range of motion. Neck supple. No JVD present. No thyromegaly present.  Cardiovascular: Normal rate, regular rhythm and intact distal pulses. Exam reveals no gallop and no friction rub.  No murmur heard. Respiratory: Effort normal and breath sounds normal. No respiratory distress. She has no wheezes. She has no rales.  GI: Soft. Bowel sounds  are normal. She exhibits no distension. There is tenderness.  Musculoskeletal: Normal range of motion. She exhibits no edema.  No arthritis, no gout  Lymphadenopathy:    She has no cervical adenopathy.  Neurological: She is alert and oriented to person, place, and time. No cranial nerve deficit.  No dysarthria, no aphasia  Skin: Skin is warm and dry. No rash noted. No erythema.  Psychiatric: She has a normal mood and affect. Her behavior is normal. Judgment and thought content normal.    LABORATORY PANEL:   CBC Recent Labs  Lab 09/23/17 2057  WBC 7.6  HGB 13.3  HCT 39.5  PLT 318   ------------------------------------------------------------------------------------------------------------------  Chemistries  Recent Labs  Lab 09/23/17 2057  NA 138  K 3.6  CL 102  CO2 26  GLUCOSE 121*  BUN 14  CREATININE 0.50  CALCIUM 9.3  AST 23  ALT 20  ALKPHOS 77  BILITOT 0.6   ------------------------------------------------------------------------------------------------------------------  Cardiac Enzymes Recent Labs  Lab 09/23/17 2057  TROPONINI <0.03   ------------------------------------------------------------------------------------------------------------------  RADIOLOGY:  Ct Abdomen Pelvis W Contrast  Result Date: 09/23/2017 CLINICAL DATA:  Generalized chest, abdominal pain radiating to back. Shortness of breath. Assess for abdominal infection. EXAM: CT ABDOMEN AND PELVIS WITH CONTRAST TECHNIQUE: Multidetector CT imaging of the abdomen and pelvis was performed using the standard protocol following bolus administration of intravenous contrast. CONTRAST:  ISOVUE-300 IOPAMIDOL (ISOVUE-300) INJECTION 61% COMPARISON:  CT abdomen and pelvis October 25, 2016 FINDINGS: LOWER CHEST: In subpleural atelectasis. No pleural effusion or focal consolidation. Included heart size is normal. No pericardial effusion. HEPATOBILIARY: Tiny calcified gallstone without CT findings of  acute cholecystitis. PANCREAS: Normal. SPLEEN: Normal. ADRENALS/URINARY TRACT: Kidneys are orthotopic, demonstrating symmetric enhancement. No nephrolithiasis, hydronephrosis or solid renal masses. The unopacified ureters are normal in course and caliber. Delayed imaging through the kidneys demonstrates symmetric prompt contrast excretion within the proximal urinary collecting system. Urinary bladder is partially distended and unremarkable. 11 mm LEFT adrenal nodule, -17 Hounsfield units on prior noncontrast CT consistent with benign adrenal adenoma. STOMACH/BOWEL: The stomach, large bowel are normal in course and caliber without inflammatory changes. Multiple loops of fluid-filled small bowel in LEFT upper quadrant with mild distention less than 3 cm. 2 adjacent small bowel transition points (coronal 53/104). Predominately deep compress: VASCULAR/LYMPHATIC: Aortoiliac vessels are normal in course and caliber. Mild calcific atherosclerosis. No lymphadenopathy by CT size criteria. REPRODUCTIVE: Normal. OTHER: No intraperitoneal free fluid or free air. Mild rectus abdominis diastases with small fat containing umbilical hernia. MUSCULOSKELETAL: Nonacute. Osteopenia. Moderate T12 compression fracture, progressed height loss from 2017. IMPRESSION: 1. Low-grade small obstruction with suspected internal hernia. 2. Cholelithiasis without acute cholecystitis. 3. Similar bibasilar bronchiectasis with subpleural atelectasis. Recommend CT chest on a nonemergent basis to evaluate extent of disease. 4. Osteopenia, moderate T12 compression fracture, progressed height loss from 2017. Aortic Atherosclerosis (ICD10-I70.0). Electronically Signed   By: Awilda Metro M.D.   On: 09/23/2017 22:29   Dg Chest Port 1 View  Result Date: 09/23/2017 CLINICAL DATA:  Chest pain EXAM: PORTABLE CHEST 1 VIEW COMPARISON:  06/29/2017 FINDINGS: Linear scarring or atelectasis at the bases. No focal consolidation or effusion. Normal heart size.  No pneumothorax. IMPRESSION: Linear atelectasis or scarring at the bases. Electronically Signed   By: Jasmine Pang M.D.   On: 09/23/2017 21:37   Dg Abd Portable 1 View  Result Date: 09/24/2017 CLINICAL DATA:  56 year old female status post enteric tube placement. EXAM: PORTABLE ABDOMEN - 1 VIEW COMPARISON:  Abdominal CT dated 09/23/2017 FINDINGS: Evaluation is limited due to patient's body habitus and overlying soft tissues. An enteric tube is noted with side-port just distal to the gastroesophageal junction and tip in the proximal stomach. Recommend further advancing of the tube into the stomach. Bibasilar streaky atelectatic changes noted. IMPRESSION: Partially visualized enteric tube in the proximal stomach. Recommend further advancing the tube into the stomach. Electronically Signed   By: Elgie Collard M.D.   On: 09/24/2017 00:11    EKG:   Orders placed or performed during the hospital encounter of 09/23/17  . EKG 12-Lead  . EKG 12-Lead  . ED EKG within 10 minutes  . ED EKG within 10 minutes    IMPRESSION AND PLAN:  Principal Problem:   SBO (small bowel obstruction) (HCC) - NG tube placed with improvement in symptoms.  PRN antiemetics and analgesia, surgery following Active Problems:   COPD, severe (HCC) - continue home O2 and home inhalers  All  the records are reviewed and case discussed with ED provider. Management plans discussed with the patient and/or family.  DVT PROPHYLAXIS: SubQ lovenox  GI PROPHYLAXIS: None  ADMISSION STATUS: Inpatient  CODE STATUS: Full Code Status History    Date Active Date Inactive Code Status Order ID Comments User Context   06/29/2017 23:06 07/01/2017 14:16 Full Code [id number]  Oralia ManisWillis, Richad Ramsay, MD Inpatient   05/03/2017 03:14 05/06/2017 18:18 Full Code [id number]  Ihor AustinPyreddy, Pavan, MD Inpatient   05/03/2017 03:14 05/03/2017 03:14 Full Code [id number]  Ricarda FrameWoodham, Charles, MD Inpatient      TOTAL TIME TAKING CARE OF THIS PATIENT: 45 minutes.    Caley Volkert FIELDING 09/24/2017, 1:24 AM  [facility]  Office  [phone]  CC: Primary care physician; Cain SieveKlipstein, Christopher, MD  Note:  This document was prepared using Dragon voice recognition software and may include unintentional dictation errors.

## 2017-09-24 NOTE — Progress Notes (Signed)
[facility] [place] [facility]   PATIENT NAME: Renee CenterBonnie Cochran    MR#:  [id number]  DATE OF BIRTH:  August 10, 1961  SUBJECTIVE:  CHIEF COMPLAINT: Patient reports abdominal discomfort, reports no flatus  REVIEW OF SYSTEMS:  CONSTITUTIONAL: No fever, fatigue or weakness.  EYES: No blurred or double vision.  EARS, NOSE, AND THROAT: No tinnitus or ear pain.  RESPIRATORY: No cough, shortness of breath, wheezing or hemoptysis.  CARDIOVASCULAR: No chest pain, orthopnea, edema.  GASTROINTESTINAL: No nausea, vomiting, diarrhea.  Reports abdominal discomfort GENITOURINARY: No dysuria, hematuria.  ENDOCRINE: No polyuria, nocturia,  HEMATOLOGY: No anemia, easy bruising or bleeding SKIN: No rash or lesion. MUSCULOSKELETAL: No joint pain or arthritis.   NEUROLOGIC: No tingling, numbness, weakness.  PSYCHIATRY: No anxiety or depression.   DRUG ALLERGIES:   Allergies  Allergen Reactions  . Basil Oil Anaphylaxis  . Garlic Anaphylaxis and Nausea And Vomiting  . Onion Anaphylaxis  . Penicillins Anaphylaxis    Has patient had a PCN reaction causing immediate rash, facial/tongue/throat swelling, SOB or lightheadedness with hypotension: yes Has patient had a PCN reaction causing severe rash involving mucus membranes or skin necrosis: yes Has patient had a PCN reaction that required hospitalization yes Has patient had a PCN reaction occurring within the last 10 years: no If all of the above answers are "NO", then may proceed with Cephalosporin use.   . Sulfa Antibiotics Anaphylaxis    VITALS:  Blood pressure (!) 147/82, pulse (!) 108, temperature 98 F (36.7 C), temperature source Oral, resp. rate 19, height 5\' 3"  (1.6 m), weight 68.4 kg (150 lb 12.8 oz), SpO2 98 %.  PHYSICAL EXAMINATION:  GENERAL:  56 y.o.-year-old patient lying in the bed with no acute distress.  EYES: Pupils equal, round, reactive to light and accommodation. No scleral icterus. Extraocular muscles  intact.  HEENT: Head atraumatic, normocephalic. Oropharynx and nasopharynx clear.  NG tube intact NECK:  Supple, no jugular venous distention. No thyroid enlargement, no tenderness.  LUNGS: Normal breath sounds bilaterally, no wheezing, rales,rhonchi or crepitation. No use of accessory muscles of respiration.  CARDIOVASCULAR: S1, S2 normal. No murmurs, rubs, or gallops.  ABDOMEN: Soft, diffusely tender, nondistended. Bowel sounds present.  EXTREMITIES: No pedal edema, cyanosis, or clubbing.  NEUROLOGIC: Cranial nerves II through XII are intact. Muscle strength 5/5 in all extremities. Sensation intact. Gait not checked.  PSYCHIATRIC: The patient is alert and oriented x 3.  SKIN: No obvious rash, lesion, or ulcer.    LABORATORY PANEL:   CBC Recent Labs  Lab 09/24/17 0342  WBC 5.3  HGB 12.6  HCT 37.7  PLT 289   ------------------------------------------------------------------------------------------------------------------  Chemistries  Recent Labs  Lab 09/23/17 2057 09/24/17 0342  NA 138 140  K 3.6 3.9  CL 102 101  CO2 26 30  GLUCOSE 121* 112*  BUN 14 12  CREATININE 0.50 0.56  CALCIUM 9.3 8.9  AST 23  --   ALT 20  --   ALKPHOS 77  --   BILITOT 0.6  --    ------------------------------------------------------------------------------------------------------------------  Cardiac Enzymes Recent Labs  Lab 09/23/17 2057  TROPONINI <0.03   ------------------------------------------------------------------------------------------------------------------  RADIOLOGY:  Ct Abdomen Pelvis W Contrast  Result Date: 09/23/2017 CLINICAL DATA:  Generalized chest, abdominal pain radiating to back. Shortness of breath. Assess for abdominal infection. EXAM: CT ABDOMEN AND PELVIS WITH CONTRAST TECHNIQUE: Multidetector CT imaging of the abdomen and pelvis was performed using the standard protocol following bolus administration of intravenous contrast. CONTRAST:  100mL ISOVUE-300  IOPAMIDOL (ISOVUE-300) INJECTION 61% COMPARISON:  CT abdomen and pelvis October 25, 2016 FINDINGS: LOWER CHEST: In subpleural atelectasis. No pleural effusion or focal consolidation. Included heart size is normal. No pericardial effusion. HEPATOBILIARY: Tiny calcified gallstone without CT findings of acute cholecystitis. PANCREAS: Normal. SPLEEN: Normal. ADRENALS/URINARY TRACT: Kidneys are orthotopic, demonstrating symmetric enhancement. No nephrolithiasis, hydronephrosis or solid renal masses. The unopacified ureters are normal in course and caliber. Delayed imaging through the kidneys demonstrates symmetric prompt contrast excretion within the proximal urinary collecting system. Urinary bladder is partially distended and unremarkable. 11 mm LEFT adrenal nodule, -17 Hounsfield units on prior noncontrast CT consistent with benign adrenal adenoma. STOMACH/BOWEL: The stomach, large bowel are normal in course and caliber without inflammatory changes. Multiple loops of fluid-filled small bowel in LEFT upper quadrant with mild distention less than 3 cm. 2 adjacent small bowel transition points (coronal 53/104). Predominately deep compress: VASCULAR/LYMPHATIC: Aortoiliac vessels are normal in course and caliber. Mild calcific atherosclerosis. No lymphadenopathy by CT size criteria. REPRODUCTIVE: Normal. OTHER: No intraperitoneal free fluid or free air. Mild rectus abdominis diastases with small fat containing umbilical hernia. MUSCULOSKELETAL: Nonacute. Osteopenia. Moderate T12 compression fracture, progressed height loss from 2017. IMPRESSION: 1. Low-grade small obstruction with suspected internal hernia. 2. Cholelithiasis without acute cholecystitis. 3. Similar bibasilar bronchiectasis with subpleural atelectasis. Recommend CT chest on a nonemergent basis to evaluate extent of disease. 4. Osteopenia, moderate T12 compression fracture, progressed height loss from 2017. Aortic Atherosclerosis (ICD10-I70.0).  Electronically Signed   By: Awilda Metroourtnay  Bloomer M.D.   On: 09/23/2017 22:29   Dg Chest Port 1 View  Result Date: 09/23/2017 CLINICAL DATA:  Chest pain EXAM: PORTABLE CHEST 1 VIEW COMPARISON:  06/29/2017 FINDINGS: Linear scarring or atelectasis at the bases. No focal consolidation or effusion. Normal heart size. No pneumothorax. IMPRESSION: Linear atelectasis or scarring at the bases. Electronically Signed   By: Jasmine PangKim  Fujinaga M.D.   On: 09/23/2017 21:37   Dg Abd Portable 1 View  Result Date: 09/24/2017 CLINICAL DATA:  56 year old female status post enteric tube placement. EXAM: PORTABLE ABDOMEN - 1 VIEW COMPARISON:  Abdominal CT dated 09/23/2017 FINDINGS: Evaluation is limited due to patient's body habitus and overlying soft tissues. An enteric tube is noted with side-port just distal to the gastroesophageal junction and tip in the proximal stomach. Recommend further advancing of the tube into the stomach. Bibasilar streaky atelectatic changes noted. IMPRESSION: Partially visualized enteric tube in the proximal stomach. Recommend further advancing the tube into the stomach. Electronically Signed   By: Elgie CollardArash  Radparvar M.D.   On: 09/24/2017 00:11    EKG:   Orders placed or performed during the hospital encounter of 09/23/17  . EKG 12-Lead  . EKG 12-Lead  . ED EKG within 10 minutes  . ED EKG within 10 minutes    ASSESSMENT AND PLAN:   Renee Cochran  is a 56 y.o. female who presents with epigastric abdominal pain.  Patient has also had nausea without vomiting.  Here in the ED she was found on imaging to have SBO, likely internal hernia.  # SBO (small bowel obstruction) (HCC) -  Surgery is recommending conservative management , discussed with Dr. Aleen CampiPiscoya NG tube for decompression  PRN antiemetics and analgesia  #Chronic COPD No exacerbation.  Continue breathing treatments as needed, monitor closely  #Chronic T12 compression fracture per imaging Pain management as needed  All the  records are reviewed and case discussed with Care Management/Social Workerr. Management plans discussed with the patient, family and they are in  agreement.  CODE STATUS: FC   TOTAL TIME TAKING CARE OF THIS PATIENT - 36 minutes.   POSSIBLE D/C IN 2 DAYS, DEPENDING ON CLINICAL CONDITION.  Note: This dictation was prepared with Dragon dictation along with smaller phrase technology. Any transcriptional errors that result from this process are unintentional.   Ramonita Lab M.D on 09/24/2017 at 1:09 PM  Between 7am to 6pm - Pager - [phone] After 6pm go to [email or web address] - password [email or web address] [facility]  [facility]  Office  [phone]  CC: Primary care physician; Cain Sieve, MD

## 2017-09-24 NOTE — Consult Note (Signed)
SURGICAL CONSULTATION NOTE (initial) - cpt: 99255  HISTORY OF PRESENT ILLNESS (HPI):  56 y.o. female presented to [facility] ED for evaluation of abdominal pain. Patient reports she earlier today developed gradual onset of upper abdominal pain. Because this is how she describes she initially experienced her recent pneumothorax, she decided to seek ED evaluation. Patient also reports a BM today and yesterday with flatus yesterday and N/V following insertion of NG tube, but says her pain resolved with a single dose of pain medication, and her nausea has also improved with nasogastric drainage of 500 mL yellow fluid. Patient also reports chronic SOB with 3 L O2 via nasal canula for severe COPD and states clearly and unequivocally she does not want surgery under general anesthesia with any risk of remaining intubated. Rather, patient says she wishes to attempt treatment with NG tube and prefers hospice to surgery if her bowel obstruction does not resolve with non-operative management.  Surgery is consulted by ED physician Dr. Lamont Snowball in this context for evaluation and management of SBO.  PAST MEDICAL HISTORY (PMH):  Past Medical History:  Diagnosis Date  . Asthma   . Collapse of left lung   . COPD (chronic obstructive pulmonary disease) (HCC)      PAST SURGICAL HISTORY (PSH):  Past Surgical History:  Procedure Laterality Date  . CESAREAN SECTION     x 2  . CHEST TUBE INSERTION    . RECTAL SURGERY     for tumor as a child     MEDICATIONS:  Prior to Admission medications   Medication Sig Start Date End Date Taking? Authorizing Provider  albuterol (PROVENTIL HFA;VENTOLIN HFA) 108 (90 Base) MCG/ACT inhaler Inhale 2 puffs into the lungs every 4 (four) hours as needed. 04/16/16   [provider]  albuterol (PROVENTIL) (2.5 MG/3ML) 0.083% nebulizer solution Take 2.5 mg by nebulization every 4 (four) hours as needed for wheezing or shortness of breath.    [provider]  azithromycin  (ZITHROMAX) 250 MG tablet Take 1 tablet (250 mg total) by mouth daily. 07/01/17   Milagros Loll, MD  Diclofenac Sodium 3 % GEL Place 1 application onto the skin every 12 (twelve) hours as needed (pain). Patient not taking: Reported on 05/04/2017 10/25/16   Myrna Blazer, MD  fluticasone [facility]) 50 MCG/ACT nasal spray Place 1 spray into both nostrils daily. 05/07/17   Katha Hamming, MD  fluticasone furoate-vilanterol (BREO ELLIPTA) 100-25 MCG/INH AEPB Inhale 1 puff into the lungs daily. Patient not taking: Reported on 06/30/2017 05/06/17   Katha Hamming, MD  ipratropium (ATROVENT) 0.02 % nebulizer solution Take 1 mcg by nebulization 3 (three) times daily.  10/14/16   [provider]  montelukast (SINGULAIR) 10 MG tablet Take 1 tablet by mouth at bedtime. 09/27/16   [provider]  predniSONE (STERAPRED UNI-PAK 21 TAB) 10 MG (21) TBPK tablet Taper by 10 mg daily 07/01/17   Milagros Loll, MD  tiotropium (SPIRIVA) 18 MCG inhalation capsule Place 18 mcg into inhaler and inhale daily.    [provider]     ALLERGIES:  Allergies  Allergen Reactions  . Basil Oil Anaphylaxis  . Garlic Anaphylaxis and Nausea And Vomiting  . Onion Anaphylaxis  . Penicillins Anaphylaxis    Has patient had a PCN reaction causing immediate rash, facial/tongue/throat swelling, SOB or lightheadedness with hypotension: yes Has patient had a PCN reaction causing severe rash involving mucus membranes or skin necrosis: yes Has patient had a PCN reaction that required hospitalization yes Has  patient had a PCN reaction occurring within the last 10 years: no If all of the above answers are "NO", then may proceed with Cephalosporin use.   . Sulfa Antibiotics Anaphylaxis     SOCIAL HISTORY:  Social History   Socioeconomic History  . Marital status: Married    Spouse name: Not on file  . Number of children: Not on file  . Years of education: Not on file  . Highest  education level: Not on file  Social Needs  . Financial resource strain: Not on file  . Food insecurity - worry: Not on file  . Food insecurity - inability: Not on file  . Transportation needs - medical: Not on file  . Transportation needs - non-medical: Not on file  Occupational History  . Occupation: retired  Tobacco Use  . Smoking status: Current Some Day Smoker    Packs/day: 0.50    Types: Cigarettes  . Smokeless tobacco: Never Used  Substance and Sexual Activity  . Alcohol use: No  . Drug use: No  . Sexual activity: Not Currently  Other Topics Concern  . Not on file  Social History Narrative  . Not on file    The patient currently resides (home / rehab facility / nursing home): Home The patient normally is (ambulatory / bedbound): Ambulation limited by SOB   FAMILY HISTORY:  Family History  Problem Relation Age of Onset  . Colon cancer Father   . Colon cancer Paternal Grandfather   . Diabetes Neg Hx   . Hypertension Neg Hx      REVIEW OF SYSTEMS:  Constitutional: denies weight loss, fever, chills, or sweats  Eyes: denies any other vision changes, history of eye injury  ENT: denies sore throat, hearing problems  Respiratory: denies shortness of breath, wheezing  Cardiovascular: denies chest pain, palpitations  Gastrointestinal: abdominal pain, N/V, and bowel function as per HPI Genitourinary: denies burning with urination or urinary frequency Musculoskeletal: denies any other joint pains or cramps  Skin: denies any other rashes or skin discolorations  Neurological: denies any other headache, dizziness, weakness  Psychiatric: denies any other depression, anxiety   All other review of systems were negative   VITAL SIGNS:  Temp:  [97.9 F (36.6 C)] 97.9 F (36.6 C) (11/16 2053) Pulse Rate:  [92-123] 100 (11/17 0030) Resp:  [11-25] 15 (11/17 0030) BP: (133-166)/(70-86) 151/80 (11/17 0030) SpO2:  [88 %-98 %] 96 % (11/17 0030) Weight:  [150 lb (68 kg)] 150 lb  (68 kg) (11/16 2053)       Weight: 150 lb (68 kg)     INTAKE/OUTPUT:  This shift: Total I/O In: -  Out: 350 [Emesis/NG output:350]  Last 2 shifts: @IOLAST2SHIFTS @   PHYSICAL EXAM:  Constitutional:  -- Normal body habitus  -- Awake, alert, and oriented x3, no apparent distress Eyes:  -- Pupils equally round and reactive to light  -- No scleral icterus, B/L no occular discharge Ear, nose, throat: -- Neck is FROM WNL -- No jugular venous distension  Pulmonary:  -- No wheezes or rhales -- Breath sounds decreased equally bilaterally -- Breathing mild-moderately labored at rest on 3L oxygen via [place] Cardiovascular:  -- S1, S2 present  -- No pericardial rubs  Gastrointestinal:  -- Abdomen soft, nontender, and non- to minimally- distended with no guarding or rebound tenderness -- No abdominal masses appreciated, pulsatile or otherwise  Musculoskeletal and Integumentary:  -- Wounds or skin discoloration: None appreciated -- Extremities: B/L UE and LE FROM, hands and  feet warm  Neurologic:  -- Motor function: Intact and symmetric -- Sensation: Intact and symmetric Psychiatric:  -- Mood and affect WNL  Labs:  CBC Latest Ref Rng & Units 09/23/2017 06/30/2017 06/29/2017  WBC 3.6 - 11.0 K/uL 7.6 9.4 8.8  Hemoglobin 12.0 - 16.0 g/dL 40.913.3 81.114.0 91.414.0  Hematocrit 35.0 - 47.0 % 39.5 40.9 41.7  Platelets 150 - 440 K/uL 318 161 313   CMP Latest Ref Rng & Units 09/23/2017 06/30/2017 06/29/2017  Glucose 65 - 99 mg/dL 782(N121(H) 562(Z228(H) 308(M104(H)  BUN 6 - 20 mg/dL 14 12 12   Creatinine 0.44 - 1.00 mg/dL 5.780.50 4.690.74 6.290.58  Sodium 135 - 145 mmol/L 138 136 139  Potassium 3.5 - 5.1 mmol/L 3.6 4.6 4.3  Chloride 101 - 111 mmol/L 102 101 102  CO2 22 - 32 mmol/L 26 25 28   Calcium 8.9 - 10.3 mg/dL 9.3 9.6 9.7  Total Protein 6.5 - 8.1 g/dL 6.6 - 7.5  Total Bilirubin 0.3 - 1.2 mg/dL 0.6 - 0.6  Alkaline Phos 38 - 126 U/L 77 - 71  AST 15 - 41 U/L 23 - 37  ALT 14 - 54 U/L 20 - 32   Imaging studies:  CT Abdomen  and Pelvis with IV Contrast (09/23/2017) The stomach, large bowel are normal in course and caliber without inflammatory changes. Multiple loops of fluid-filled small bowel in LEFT upper quadrant with mild distention less than 3 cm. 2 adjacent small bowel transition points (coronal 53/104). Predominately decompressed.  Assessment/Plan: (ICD-10's: 84K56.51) 56 y.o. female with partial vs complete small bowel obstruction with possible internal hernia visualized on coronal images of CT, possibly attributable to post-surgical adhesions following c-sections x 2 and childhood rectal surgery, complicated by pertinent comorbidities including severe COPD with baseline SOB on 3 L oxygen via [place].              - NPO, IVF              - NG tube for nasogastric decompression             - monitor ongoing bowel function and abdominal exam              - in the absence of internal hernia, symptomatic relief anticipated within 24 - 48 hours following NGT insertion, followed by "rumbling" the following day and flatus either the same day or the day following the "rumbling" with anticipated length of stay ~3 - 5 days with successful non-operative management for 8 of 10 patients with small bowel obstruction secondary to adhesions  - surgical intervention if doesn't improve was also discussed, but patient states she would rather be discharged home with hospice than risk prolonged intubation/ventilation with tracheostomy following surgery with general anesthesia  - patient also states in presence of her husband her wishes to be DNR and DNI             - medical management of comorbidities as per medical team             - DVT prophylaxis   -- Barbara CowerJason E. Earlene Plateravis, MD, RPVI [place]: [facility] General Surgery - Partnering for exceptional care. Office: [phone]

## 2017-09-24 NOTE — ED Notes (Signed)
Belongings (bag with shirt bra and purse), and home O2 tank transported to floor with pt

## 2017-09-24 NOTE — ED Notes (Signed)
NG at 64 per Earlene Plateravis, MD

## 2017-09-24 NOTE — ED Notes (Signed)
Pt husband went home and took pt medications with.   Pt shirt, bra, purse in belongings bag

## 2017-09-24 NOTE — Progress Notes (Signed)
09/24/2017  Subjective: Patient admitted overnight with small bowel obstruction.  She does not want surgery due to her pulmonary issues.  NG tube in place and feels better this morning.  Reports some flatus this morning  Vital signs: Temp:  [97.9 F (36.6 C)-98.3 F (36.8 C)] 98 F (36.7 C) (11/17 0938) Pulse Rate:  [92-123] 108 (11/17 0938) Resp:  [9-25] 19 (11/17 0235) BP: (133-170)/(68-87) 147/82 (11/17 0938) SpO2:  [88 %-98 %] 98 % (11/17 0938) Weight:  [68 kg (150 lb)-68.4 kg (150 lb 12.8 oz)] 68.4 kg (150 lb 12.8 oz) (11/17 0235)   Intake/Output: 11/16 0701 - 11/17 0700 In: 50.8 [I.V.:50.8] Out: 350 [Emesis/NG output:350] Last BM Date: 09/23/17  Physical Exam: Constitutional: No acute distress Abdomen:  Soft, less distended, nontender to palpation.  NG tube in place.  Labs:  Recent Labs    09/23/17 2057 09/24/17 0342  WBC 7.6 5.3  HGB 13.3 12.6  HCT 39.5 37.7  PLT 318 289   Recent Labs    09/23/17 2057 09/24/17 0342  NA 138 140  K 3.6 3.9  CL 102 101  CO2 26 30  GLUCOSE 121* 112*  BUN 14 12  CREATININE 0.50 0.56  CALCIUM 9.3 8.9   No results for input(s): LABPROT, INR in the last 72 hours.  Imaging: Ct Abdomen Pelvis W Contrast  Result Date: 09/23/2017 CLINICAL DATA:  Generalized chest, abdominal pain radiating to back. Shortness of breath. Assess for abdominal infection. EXAM: CT ABDOMEN AND PELVIS WITH CONTRAST TECHNIQUE: Multidetector CT imaging of the abdomen and pelvis was performed using the standard protocol following bolus administration of intravenous contrast. CONTRAST:  100mL ISOVUE-300 IOPAMIDOL (ISOVUE-300) INJECTION 61% COMPARISON:  CT abdomen and pelvis October 25, 2016 FINDINGS: LOWER CHEST: In subpleural atelectasis. No pleural effusion or focal consolidation. Included heart size is normal. No pericardial effusion. HEPATOBILIARY: Tiny calcified gallstone without CT findings of acute cholecystitis. PANCREAS: Normal. SPLEEN: Normal.  ADRENALS/URINARY TRACT: Kidneys are orthotopic, demonstrating symmetric enhancement. No nephrolithiasis, hydronephrosis or solid renal masses. The unopacified ureters are normal in course and caliber. Delayed imaging through the kidneys demonstrates symmetric prompt contrast excretion within the proximal urinary collecting system. Urinary bladder is partially distended and unremarkable. 11 mm LEFT adrenal nodule, -17 Hounsfield units on prior noncontrast CT consistent with benign adrenal adenoma. STOMACH/BOWEL: The stomach, large bowel are normal in course and caliber without inflammatory changes. Multiple loops of fluid-filled small bowel in LEFT upper quadrant with mild distention less than 3 cm. 2 adjacent small bowel transition points (coronal 53/104). Predominately deep compress: VASCULAR/LYMPHATIC: Aortoiliac vessels are normal in course and caliber. Mild calcific atherosclerosis. No lymphadenopathy by CT size criteria. REPRODUCTIVE: Normal. OTHER: No intraperitoneal free fluid or free air. Mild rectus abdominis diastases with small fat containing umbilical hernia. MUSCULOSKELETAL: Nonacute. Osteopenia. Moderate T12 compression fracture, progressed height loss from 2017. IMPRESSION: 1. Low-grade small obstruction with suspected internal hernia. 2. Cholelithiasis without acute cholecystitis. 3. Similar bibasilar bronchiectasis with subpleural atelectasis. Recommend CT chest on a nonemergent basis to evaluate extent of disease. 4. Osteopenia, moderate T12 compression fracture, progressed height loss from 2017. Aortic Atherosclerosis (ICD10-I70.0). Electronically Signed   By: Awilda Metroourtnay  Bloomer M.D.   On: 09/23/2017 22:29   Dg Chest Port 1 View  Result Date: 09/23/2017 CLINICAL DATA:  Chest pain EXAM: PORTABLE CHEST 1 VIEW COMPARISON:  06/29/2017 FINDINGS: Linear scarring or atelectasis at the bases. No focal consolidation or effusion. Normal heart size. No pneumothorax. IMPRESSION: Linear atelectasis or  scarring at the bases.  Electronically Signed   By: Jasmine PangKim  Fujinaga M.D.   On: 09/23/2017 21:37   Dg Abd Portable 1 View  Result Date: 09/24/2017 CLINICAL DATA:  56 year old female status post enteric tube placement. EXAM: PORTABLE ABDOMEN - 1 VIEW COMPARISON:  Abdominal CT dated 09/23/2017 FINDINGS: Evaluation is limited due to patient's body habitus and overlying soft tissues. An enteric tube is noted with side-port just distal to the gastroesophageal junction and tip in the proximal stomach. Recommend further advancing of the tube into the stomach. Bibasilar streaky atelectatic changes noted. IMPRESSION: Partially visualized enteric tube in the proximal stomach. Recommend further advancing the tube into the stomach. Electronically Signed   By: Elgie CollardArash  Radparvar M.D.   On: 09/24/2017 00:11    Assessment/Plan: 56 yo female with SBO  --CT scan from yesterday reviewed.  Overall, only minimally distended small bowel loops, no evidence of inflammation or bowel thickening.  No fluid in abdomen.  Uncertain if there would be an internal hernia or not, but the patient is currently asymptomatic and has no abdominal pain. --Patient with some flatus today.  Given higher output overnight from NG tube and just starting conservative measures, would recommend keeping NG tube to suction today still, and will reassess tomorrow for possible removal. --Gentle IV fluid hydration.   Howie IllJose Luis Summer Parthasarathy, MD [facility]

## 2017-09-24 NOTE — ED Notes (Signed)
Pt assist to toilet by EDT Alissa

## 2017-09-24 NOTE — ED Notes (Signed)
Admitting MD at bedside.

## 2017-09-25 ENCOUNTER — Other Ambulatory Visit: Payer: Self-pay

## 2017-09-25 LAB — GLUCOSE, CAPILLARY: Glucose-Capillary: 101 mg/dL — ABNORMAL HIGH (ref 65–99)

## 2017-09-25 MED ORDER — IPRATROPIUM-ALBUTEROL 0.5-2.5 (3) MG/3ML IN SOLN
3.0000 mL | Freq: Three times a day (TID) | RESPIRATORY_TRACT | Status: DC
  Administered 2017-09-25 – 2017-09-27 (×7): 3 mL via RESPIRATORY_TRACT
  Filled 2017-09-25 (×7): qty 3

## 2017-09-25 MED ORDER — IPRATROPIUM-ALBUTEROL 0.5-2.5 (3) MG/3ML IN SOLN: 3.0000 mL | Freq: Four times a day (QID) | RESPIRATORY_TRACT | Status: DC | PRN

## 2017-09-25 MED ORDER — ORAL CARE MOUTH RINSE
15.0000 mL | Freq: Two times a day (BID) | OROMUCOSAL | Status: DC
  Administered 2017-09-25: 15 mL via OROMUCOSAL

## 2017-09-25 MED ORDER — CHLORHEXIDINE GLUCONATE 0.12 % MT SOLN
15.0000 mL | Freq: Two times a day (BID) | OROMUCOSAL | Status: DC
  Administered 2017-09-25 – 2017-09-26 (×4): 15 mL via OROMUCOSAL
  Filled 2017-09-25 (×4): qty 15

## 2017-09-25 NOTE — Progress Notes (Signed)
[facility] - [place] at [facility]   PATIENT NAME: Renee Cochran    MR#:  [id number]  DATE OF BIRTH:  Apr 05, 1961  SUBJECTIVE:  CHIEF COMPLAINT: Patient feeling better, NG tube clamped by surgery  REVIEW OF SYSTEMS:  CONSTITUTIONAL: No fever, fatigue or weakness.  EYES: No blurred or double vision.  EARS, NOSE, AND THROAT: No tinnitus or ear pain.  RESPIRATORY: No cough, shortness of breath, wheezing or hemoptysis.  CARDIOVASCULAR: No chest pain, orthopnea, edema.  GASTROINTESTINAL: No nausea, vomiting, diarrhea.  Reports improving abdominal discomfort GENITOURINARY: No dysuria, hematuria.  ENDOCRINE: No polyuria, nocturia,  HEMATOLOGY: No anemia, easy bruising or bleeding SKIN: No rash or lesion. MUSCULOSKELETAL: No joint pain or arthritis.   NEUROLOGIC: No tingling, numbness, weakness.  PSYCHIATRY: No anxiety or depression.   DRUG ALLERGIES:   Allergies  Allergen Reactions  . Basil Oil Anaphylaxis  . Garlic Anaphylaxis and Nausea And Vomiting  . Onion Anaphylaxis  . Penicillins Anaphylaxis    Has patient had a PCN reaction causing immediate rash, facial/tongue/throat swelling, SOB or lightheadedness with hypotension: yes Has patient had a PCN reaction causing severe rash involving mucus membranes or skin necrosis: yes Has patient had a PCN reaction that required hospitalization yes Has patient had a PCN reaction occurring within the last 10 years: no If all of the above answers are "NO", then may proceed with Cephalosporin use.   . Sulfa Antibiotics Anaphylaxis    VITALS:  Blood pressure (!) 122/59, pulse (!) 121, temperature 98.4 F (36.9 C), temperature source Oral, resp. rate 18, height 5\' 3"  (1.6 m), weight 68.4 kg (150 lb 12.8 oz), SpO2 94 %.  PHYSICAL EXAMINATION:  GENERAL:  56 y.o.-year-old patient lying in the bed with no acute distress.  EYES: Pupils equal, round, reactive to light and accommodation. No scleral icterus. Extraocular  muscles intact.  HEENT: Head atraumatic, normocephalic. Oropharynx and nasopharynx clear.  NG tube intact NECK:  Supple, no jugular venous distention. No thyroid enlargement, no tenderness.  LUNGS: Normal breath sounds bilaterally, no wheezing, rales,rhonchi or crepitation. No use of accessory muscles of respiration.  CARDIOVASCULAR: S1, S2 normal. No murmurs, rubs, or gallops.  ABDOMEN: Soft, minimal discomfort on palpation, less distended. Bowel sounds present.  EXTREMITIES: No pedal edema, cyanosis, or clubbing.  NEUROLOGIC: Cranial nerves II through XII are intact. Muscle strength 5/5 in all extremities. Sensation intact. Gait not checked.  PSYCHIATRIC: The patient is alert and oriented x 3.  SKIN: No obvious rash, lesion, or ulcer.    LABORATORY PANEL:   CBC Recent Labs  Lab 09/24/17 0342  WBC 5.3  HGB 12.6  HCT 37.7  PLT 289   ------------------------------------------------------------------------------------------------------------------  Chemistries  Recent Labs  Lab 09/23/17 2057 09/24/17 0342  NA 138 140  K 3.6 3.9  CL 102 101  CO2 26 30  GLUCOSE 121* 112*  BUN 14 12  CREATININE 0.50 0.56  CALCIUM 9.3 8.9  AST 23  --   ALT 20  --   ALKPHOS 77  --   BILITOT 0.6  --    ------------------------------------------------------------------------------------------------------------------  Cardiac Enzymes Recent Labs  Lab 09/23/17 2057  TROPONINI <0.03   ------------------------------------------------------------------------------------------------------------------  RADIOLOGY:  Ct Abdomen Pelvis W Contrast  Result Date: 09/23/2017 CLINICAL DATA:  Generalized chest, abdominal pain radiating to back. Shortness of breath. Assess for abdominal infection. EXAM: CT ABDOMEN AND PELVIS WITH CONTRAST TECHNIQUE: Multidetector CT imaging of the abdomen and pelvis was performed using the standard protocol following bolus administration of intravenous  contrast.  CONTRAST:  100mL ISOVUE-300 IOPAMIDOL (ISOVUE-300) INJECTION 61% COMPARISON:  CT abdomen and pelvis October 25, 2016 FINDINGS: LOWER CHEST: In subpleural atelectasis. No pleural effusion or focal consolidation. Included heart size is normal. No pericardial effusion. HEPATOBILIARY: Tiny calcified gallstone without CT findings of acute cholecystitis. PANCREAS: Normal. SPLEEN: Normal. ADRENALS/URINARY TRACT: Kidneys are orthotopic, demonstrating symmetric enhancement. No nephrolithiasis, hydronephrosis or solid renal masses. The unopacified ureters are normal in course and caliber. Delayed imaging through the kidneys demonstrates symmetric prompt contrast excretion within the proximal urinary collecting system. Urinary bladder is partially distended and unremarkable. 11 mm LEFT adrenal nodule, -17 Hounsfield units on prior noncontrast CT consistent with benign adrenal adenoma. STOMACH/BOWEL: The stomach, large bowel are normal in course and caliber without inflammatory changes. Multiple loops of fluid-filled small bowel in LEFT upper quadrant with mild distention less than 3 cm. 2 adjacent small bowel transition points (coronal 53/104). Predominately deep compress: VASCULAR/LYMPHATIC: Aortoiliac vessels are normal in course and caliber. Mild calcific atherosclerosis. No lymphadenopathy by CT size criteria. REPRODUCTIVE: Normal. OTHER: No intraperitoneal free fluid or free air. Mild rectus abdominis diastases with small fat containing umbilical hernia. MUSCULOSKELETAL: Nonacute. Osteopenia. Moderate T12 compression fracture, progressed height loss from 2017. IMPRESSION: 1. Low-grade small obstruction with suspected internal hernia. 2. Cholelithiasis without acute cholecystitis. 3. Similar bibasilar bronchiectasis with subpleural atelectasis. Recommend CT chest on a nonemergent basis to evaluate extent of disease. 4. Osteopenia, moderate T12 compression fracture, progressed height loss from 2017. Aortic Atherosclerosis  (ICD10-I70.0). Electronically Signed   By: Awilda Metroourtnay  Bloomer M.D.   On: 09/23/2017 22:29   Dg Chest Port 1 View  Result Date: 09/23/2017 CLINICAL DATA:  Chest pain EXAM: PORTABLE CHEST 1 VIEW COMPARISON:  06/29/2017 FINDINGS: Linear scarring or atelectasis at the bases. No focal consolidation or effusion. Normal heart size. No pneumothorax. IMPRESSION: Linear atelectasis or scarring at the bases. Electronically Signed   By: Jasmine PangKim  Fujinaga M.D.   On: 09/23/2017 21:37   Dg Abd Portable 1 View  Result Date: 09/24/2017 CLINICAL DATA:  56 year old female status post enteric tube placement. EXAM: PORTABLE ABDOMEN - 1 VIEW COMPARISON:  Abdominal CT dated 09/23/2017 FINDINGS: Evaluation is limited due to patient's body habitus and overlying soft tissues. An enteric tube is noted with side-port just distal to the gastroesophageal junction and tip in the proximal stomach. Recommend further advancing of the tube into the stomach. Bibasilar streaky atelectatic changes noted. IMPRESSION: Partially visualized enteric tube in the proximal stomach. Recommend further advancing the tube into the stomach. Electronically Signed   By: Elgie CollardArash  Radparvar M.D.   On: 09/24/2017 00:11    EKG:   Orders placed or performed during the hospital encounter of 09/23/17  . EKG 12-Lead  . EKG 12-Lead  . ED EKG within 10 minutes  . ED EKG within 10 minutes    ASSESSMENT AND PLAN:   Kennith CenterBonnie Buzan  is a 56 y.o. female who presents with epigastric abdominal pain.  Patient has also had nausea without vomiting.  Here in the ED she was found on imaging to have SBO, likely internal hernia.  # SBO (small bowel obstruction) (HCC) -  Surgery is recommending conservative management , discussed with Dr. Aleen CampiPiscoya NG tube clamping trial from 10 AM to 2 PM as patient clinically improving.  If less than 150 mL residual then NG tube will be removed and patient will be started on liquids  PRN antiemetics and analgesia  #Chronic COPD No  exacerbation.  Continue breathing treatments as needed, monitor  closely  #Chronic T12 compression fracture per imaging Pain management as needed  All the records are reviewed and case discussed with Care Management/Social Workerr. Management plans discussed with the patient, family and they are in agreement.  CODE STATUS: FC   TOTAL TIME TAKING CARE OF THIS PATIENT - 36 minutes.   POSSIBLE D/C IN 1 DAYS, DEPENDING ON CLINICAL CONDITION.  Note: This dictation was prepared with Dragon dictation along with smaller phrase technology. Any transcriptional errors that result from this process are unintentional.   Ramonita LabGouru, Jahleah Mariscal M.D on 09/25/2017 at 1:38 PM  Between 7am to 6pm - Pager - [phone] After 6pm go to [email or web address] - password [email or web address] [facility]  [facility]  Office  [phone]  CC: Primary care physician; Cain SieveKlipstein, Christopher, MD

## 2017-09-25 NOTE — Progress Notes (Signed)
09/25/2017  Subjective: Patient had coughing episode and NG tube came out, needing replacement.  Reports some flatus yesterday and that her abdomen is less distended.  Vital signs: Temp:  [98.4 F (36.9 C)] 98.4 F (36.9 C) (11/17 2132) Pulse Rate:  [121] 121 (11/17 2132) Resp:  [18] 18 (11/17 2132) BP: (122)/(59) 122/59 (11/17 2132) SpO2:  [94 %-96 %] 94 % (11/17 2132)   Intake/Output: 11/17 0701 - 11/18 0700 In: 2240 [P.O.:40; I.V.:1200; NG/GT:1000] Out: 600 [Emesis/NG output:600] Last BM Date: 09/23/17  Physical Exam: Constitutional: No acute distress Abdomen:  Soft, nondistended, nontender to palpation.  NG tube in place.  Labs:  Recent Labs    09/23/17 2057 09/24/17 0342  WBC 7.6 5.3  HGB 13.3 12.6  HCT 39.5 37.7  PLT 318 289   Recent Labs    09/23/17 2057 09/24/17 0342  NA 138 140  K 3.6 3.9  CL 102 101  CO2 26 30  GLUCOSE 121* 112*  BUN 14 12  CREATININE 0.50 0.56  CALCIUM 9.3 8.9   No results for input(s): LABPROT, INR in the last 72 hours.  Imaging: No results found.  Assessment/Plan: 56 yo female with SBO  --Will do clamping trial today from 10 am to 2 pm.  If < 150 ml residual, then will remove NG tube.  Otherwise would keep to suction. --Continue NPO for now with IV fluid hydration.    Howie IllJose Luis Kimetha Trulson, MD [facility]

## 2017-09-25 NOTE — Plan of Care (Signed)
  Progressing Education: Knowledge of General Education information will improve 09/25/2017 0537 - Progressing by Evan Mackie, Genelle GatherMatthew Scott, RN Health Behavior/Discharge Planning: Ability to manage health-related needs will improve 09/25/2017 0537 - Progressing by Gerasimos Plotts, Genelle GatherMatthew Scott, RN Clinical Measurements: Ability to maintain clinical measurements within normal limits will improve 09/25/2017 0537 - Progressing by Jamier Urbas, Genelle GatherMatthew Scott, RN Will remain free from infection 09/25/2017 0537 - Progressing by Jennea Rager, Genelle GatherMatthew Scott, RN Diagnostic test results will improve 09/25/2017 0537 - Progressing by Jaydyn Menon, Genelle GatherMatthew Scott, RN Respiratory complications will improve 09/25/2017 0537 - Progressing by Roxana HiresPage, Areyana Leoni Scott, RN Cardiovascular complication will be avoided 09/25/2017 0537 - Progressing by Roxana HiresPage, Adanely Reynoso Scott, RN Activity: Risk for activity intolerance will decrease 09/25/2017 0537 - Progressing by Roxana HiresPage, Serita Degroote Scott, RN Nutrition: Adequate nutrition will be maintained 09/25/2017 0537 - Progressing by Roxana HiresPage, Janyce Ellinger Scott, RN Coping: Level of anxiety will decrease 09/25/2017 0537 - Progressing by Roxana HiresPage, Kerstyn Coryell Scott, RN Elimination: Will not experience complications related to bowel motility 09/25/2017 0537 - Progressing by Roxana HiresPage, Jessicia Napolitano Scott, RN Will not experience complications related to urinary retention 09/25/2017 0537 - Progressing by Nance Mccombs, Genelle GatherMatthew Scott, RN Pain Managment: General experience of comfort will improve 09/25/2017 0537 - Progressing by Dessie Tatem, Genelle GatherMatthew Scott, RN Safety: Ability to remain free from injury will improve 09/25/2017 0537 - Progressing by Welby Montminy, Genelle GatherMatthew Scott, RN Skin Integrity: Risk for impaired skin integrity will decrease 09/25/2017 0537 - Progressing by Kashauna Celmer, Genelle GatherMatthew Scott, RN

## 2017-09-26 ENCOUNTER — Inpatient Hospital Stay: Payer: Medicare Other

## 2017-09-26 DIAGNOSIS — K5651 Intestinal adhesions [bands], with partial obstruction: Secondary | ICD-10-CM

## 2017-09-26 LAB — CBC
HCT: 33.3 % — ABNORMAL LOW (ref 35.0–47.0)
Hemoglobin: 11.4 g/dL — ABNORMAL LOW (ref 12.0–16.0)
MCH: 33.7 pg (ref 26.0–34.0)
MCHC: 34.2 g/dL (ref 32.0–36.0)
MCV: 98.7 fL (ref 80.0–100.0)
Platelets: 247 10*3/uL (ref 150–440)
RBC: 3.37 MIL/uL — ABNORMAL LOW (ref 3.80–5.20)
RDW: 14.3 % (ref 11.5–14.5)
WBC: 6.7 10*3/uL (ref 3.6–11.0)

## 2017-09-26 LAB — COMPREHENSIVE METABOLIC PANEL
ALBUMIN: 3 g/dL — AB (ref 3.5–5.0)
ALT: 13 U/L — ABNORMAL LOW (ref 14–54)
ANION GAP: 11 (ref 5–15)
AST: 17 U/L (ref 15–41)
Alkaline Phosphatase: 54 U/L (ref 38–126)
BILIRUBIN TOTAL: 0.9 mg/dL (ref 0.3–1.2)
BUN: 17 mg/dL (ref 6–20)
CO2: 24 mmol/L (ref 22–32)
Calcium: 8.2 mg/dL — ABNORMAL LOW (ref 8.9–10.3)
Chloride: 100 mmol/L — ABNORMAL LOW (ref 101–111)
Creatinine, Ser: 0.53 mg/dL (ref 0.44–1.00)
GFR calc Af Amer: >60 mL/min (ref >60)
GFR calc non Af Amer: >60 mL/min (ref >60)
GLUCOSE: 107 mg/dL — AB (ref 65–99)
POTASSIUM: 3.3 mmol/L — AB (ref 3.5–5.1)
SODIUM: 135 mmol/L (ref 135–145)
TOTAL PROTEIN: 5.7 g/dL — AB (ref 6.5–8.1)

## 2017-09-26 MED ORDER — POTASSIUM CHLORIDE CRYS ER 20 MEQ PO TBCR
40.0000 meq | EXTENDED_RELEASE_TABLET | Freq: Once | ORAL | Status: AC
  Administered 2017-09-26: 40 meq via ORAL
  Filled 2017-09-26: qty 2

## 2017-09-26 MED ORDER — MORPHINE SULFATE (PF) 2 MG/ML IV SOLN
2.0000 mg | INTRAVENOUS | Status: DC | PRN
  Administered 2017-09-26: 2 mg via INTRAVENOUS
  Filled 2017-09-26: qty 1

## 2017-09-26 NOTE — Progress Notes (Signed)
Patient alert and oriented. On 2 LO2 chronic. Denies pain, just some discomfort in lower back. Oriented to call bell system.   Harvie HeckMelanie Jessalyn Hinojosa, RN

## 2017-09-26 NOTE — Progress Notes (Signed)
CC: SBO Subjective: PT feels better, taking some clears, had some flatus. Overall feeling better   Objective: Vital signs in last 24 hours: Temp:  [97.9 F (36.6 C)-98.5 F (36.9 C)] 97.9 F (36.6 C) (11/19 0803) Pulse Rate:  [91-112] 91 (11/19 0803) Resp:  [16-19] 16 (11/19 0803) BP: (116-132)/(61-87) 132/87 (11/19 0803) SpO2:  [96 %-97 %] 96 % (11/19 0803) Last BM Date: 09/23/17  Intake/Output from previous day: 11/18 0701 - 11/19 0700 In: 956 [P.O.:480; I.V.:476] Out: 500 [Emesis/NG output:500] Intake/Output this shift: Total I/O In: 918.3 [I.V.:918.3] Out: -   Physical exam: NAD,  Abd: soft, NT, mildly distended, no peritonitis Ext: well perfused, no edema    Lab Results: CBC  Recent Labs    09/24/17 0342 09/26/17 0749  WBC 5.3 6.7  HGB 12.6 11.4*  HCT 37.7 33.3*  PLT 289 247   BMET Recent Labs    09/24/17 0342 09/26/17 0749  NA 140 135  K 3.9 3.3*  CL 101 100*  CO2 30 24  GLUCOSE 112* 107*  BUN 12 17  CREATININE 0.56 0.53  CALCIUM 8.9 8.2*   PT/INR No results for input(s): LABPROT, INR in the last 72 hours. ABG No results for input(s): PHART, HCO3 in the last 72 hours.  Invalid input(s): PCO2, PO2  Studies/Results: No results found.  Anti-infectives: Anti-infectives (From admission, onward)   None      Assessment/Plan: Resolving partial SBO Clinically (unlikely internal hernia given her clinical course) We will repeat Xray She still refusing surgical intervention Sterling Bigiego Stiven Kaspar, MD, FACS  09/26/2017

## 2017-09-26 NOTE — Progress Notes (Signed)
[facility] - [place] at [facility]   PATIENT NAME: Renee CenterBonnie Cochran    MR#:  [id number]  DATE OF BIRTH:  [birth year]  SUBJECTIVE:  CHIEF COMPLAINT: Patient feeling better, NG tube d/ced by surgery.  Patient tolerated diet well  REVIEW OF SYSTEMS:  CONSTITUTIONAL: No fever, fatigue or weakness.  EYES: No blurred or double vision.  EARS, NOSE, AND THROAT: No tinnitus or ear pain.  RESPIRATORY: No cough, shortness of breath, wheezing or hemoptysis.  CARDIOVASCULAR: No chest pain, orthopnea, edema.  GASTROINTESTINAL: No nausea, vomiting, diarrhea.  Reports improving abdominal discomfort GENITOURINARY: No dysuria, hematuria.  ENDOCRINE: No polyuria, nocturia,  HEMATOLOGY: No anemia, easy bruising or bleeding SKIN: No rash or lesion. MUSCULOSKELETAL: No joint pain or arthritis.   NEUROLOGIC: No tingling, numbness, weakness.  PSYCHIATRY: No anxiety or depression.   DRUG ALLERGIES:   Allergies  Allergen Reactions  . Basil Oil Anaphylaxis  . Garlic Anaphylaxis and Nausea And Vomiting  . Onion Anaphylaxis  . Penicillins Anaphylaxis    Has patient had a PCN reaction causing immediate rash, facial/tongue/throat swelling, SOB or lightheadedness with hypotension: yes Has patient had a PCN reaction causing severe rash involving mucus membranes or skin necrosis: yes Has patient had a PCN reaction that required hospitalization yes Has patient had a PCN reaction occurring within the last 10 years: no If all of the above answers are "NO", then may proceed with Cephalosporin use.   . Sulfa Antibiotics Anaphylaxis    VITALS:  Blood pressure 132/87, pulse 91, temperature 97.9 F (36.6 C), temperature source Oral, resp. rate 16, height 5\' 3"  (1.6 m), weight 68.4 kg (150 lb 12.8 oz), SpO2 95 %.  PHYSICAL EXAMINATION:  GENERAL:  56 y.o.-year-old patient lying in the bed with no acute distress.  EYES: Pupils equal, round, reactive to light and accommodation. No scleral  icterus. Extraocular muscles intact.  HEENT: Head atraumatic, normocephalic. Oropharynx and nasopharynx clear.  NG tube intact NECK:  Supple, no jugular venous distention. No thyroid enlargement, no tenderness.  LUNGS: Normal breath sounds bilaterally, no wheezing, rales,rhonchi or crepitation. No use of accessory muscles of respiration.  CARDIOVASCULAR: S1, S2 normal. No murmurs, rubs, or gallops.  ABDOMEN: Soft, minimal discomfort on palpation, less distended. Bowel sounds present.  EXTREMITIES: No pedal edema, cyanosis, or clubbing.  NEUROLOGIC: Cranial nerves II through XII are intact. Muscle strength 5/5 in all extremities. Sensation intact. Gait not checked.  PSYCHIATRIC: The patient is alert and oriented x 3.  SKIN: No obvious rash, lesion, or ulcer.    LABORATORY PANEL:   CBC Recent Labs  Lab 09/26/17 0749  WBC 6.7  HGB 11.4*  HCT 33.3*  PLT 247   ------------------------------------------------------------------------------------------------------------------  Chemistries  Recent Labs  Lab 09/26/17 0749  NA 135  K 3.3*  CL 100*  CO2 24  GLUCOSE 107*  BUN 17  CREATININE 0.53  CALCIUM 8.2*  AST 17  ALT 13*  ALKPHOS 54  BILITOT 0.9   ------------------------------------------------------------------------------------------------------------------  Cardiac Enzymes Recent Labs  Lab 09/23/17 2057  TROPONINI <0.03   ------------------------------------------------------------------------------------------------------------------  RADIOLOGY:  Dg Abd Acute W/chest  Result Date: 09/26/2017 CLINICAL DATA:  Diagnosed with small bowel obstruction several days. History of an unspecified type of hernia. History of asthma -COPD, former smoker, history of rectal tumor and intestinal adhesions. EXAM: DG ABDOMEN ACUTE W/ 1V CHEST COMPARISON:  Chest x-ray of September 23, 2017 and May 03, 2017. FINDINGS: The lungs are mildly hyperinflated. The interstitial markings are  coarse and more conspicuous  today. The heart is normal in size. The pulmonary vascularity is mildly prominent centrally. There is no pleural effusion. There is scarring at the right lung base. Within the upper and mid abdomen there is a moderate amount of gas within colonic loops and some within small bowel. There is minimal gas and stool in the rectum. No free extraluminal gas collections are observed. The patient has known gallstones which are not clearly evident on this study. The bony structures are unremarkable where visualized. IMPRESSION: Bowel gas pattern suggests a ileus or partial small bowel obstruction. There is no evidence of perforation. Increased pulmonary interstitial markings may reflect acute bronchitis superimposed upon COPD/asthma and smoking related changes. Electronically Signed   By: David  SwazilandJordan M.D.   On: 09/26/2017 10:39    EKG:   Orders placed or performed during the hospital encounter of 09/23/17  . EKG 12-Lead  . EKG 12-Lead  . ED EKG within 10 minutes  . ED EKG within 10 minutes    ASSESSMENT AND PLAN:   Renee CenterBonnie Cochran  is a 56 y.o. female who presents with epigastric abdominal pain.  Patient has also had nausea without vomiting.  Here in the ED she was found on imaging to have SBO, likely internal hernia.  # SBO (small bowel obstruction) (HCC) -  Surgery is recommending conservative management , discussed with Dr. Valaria GoodPabone Repeat x-ray has revealed possible partial small bowel obstruction ileus/surgery is recommending overnight observation   PRN antiemetics and analgesia  #Chronic COPD No exacerbation.  Continue breathing treatments as needed, monitor closely  #Chronic T12 compression fracture per imaging Pain management as needed  Out of bed and ambulate the patient  All the records are reviewed and case discussed with Care Management/Social Workerr. Management plans discussed with the patient, family and they are in agreement.  CODE STATUS: FC    TOTAL TIME TAKING CARE OF THIS PATIENT - 36 minutes.   POSSIBLE D/C IN 1 DAYS, DEPENDING ON CLINICAL CONDITION.  Note: This dictation was prepared with Dragon dictation along with smaller phrase technology. Any transcriptional errors that result from this process are unintentional.   Ramonita LabGouru, Marissa Weaver M.D on 09/26/2017 at 2:43 PM  Between 7am to 6pm - Pager - [phone] After 6pm go to [email or web address] - password [email or web address] [facility]  [facility]  Office  [phone]  CC: Primary care physician; Cain SieveKlipstein, Christopher, MD

## 2017-09-26 NOTE — Progress Notes (Signed)
ELECTROLYTE CONSULT  Pharmacy Consult for Electrolytes Indication: hypokalemia   Labs: Sodium (mmol/L)  Date Value  09/26/2017 135   Potassium (mmol/L)  Date Value  09/26/2017 3.3 (L)   Magnesium (mg/dL)  Date Value  16/10/960406/26/2018 2.9 (H)   Calcium (mg/dL)  Date Value  54/09/811911/19/2018 8.2 (L)   Albumin (g/dL)  Date Value  14/78/295611/19/2018 3.0 (L)    Estimated Creatinine Clearance: 71.2 mL/min (by C-G formula based on SCr of 0.53 mg/dL).  Assessment: 56 yo female admitted with small bowel obstruction. AM labs showed hypokalemia with K = 3.3.  Pharmacy has been consulted to manage and replace electrolytes.  Goal of Therapy:  K = 3.5 - 5  Plan:   Patient was given potassium chloride 40mEq PO this AM after morning labs resulted. Will obtain AM labs and monitor electrolytes. Pharmacy will continue to follow and adjust as needed.   Yolanda BonineHannah Lifsey, PharmD Pharmacy Resident 09/26/2017,2:49 PM

## 2017-09-26 NOTE — Progress Notes (Signed)
Dr Sheryle Hailiamond notified for pain medication for back pain. New order received

## 2017-09-26 NOTE — Progress Notes (Signed)
Contacted Dr. Everlene FarrierPabon to clarify if patient still needed the xray since Dr. Amado CoeGouru put in discharge orders. Patient is still to have xray per Dr. Hurman HornPabon's order.

## 2017-09-27 ENCOUNTER — Inpatient Hospital Stay: Payer: Medicare Other

## 2017-09-27 LAB — BASIC METABOLIC PANEL
ANION GAP: 9 (ref 5–15)
BUN: 15 mg/dL (ref 6–20)
CHLORIDE: 103 mmol/L (ref 101–111)
CO2: 28 mmol/L (ref 22–32)
Calcium: 8.5 mg/dL — ABNORMAL LOW (ref 8.9–10.3)
Creatinine, Ser: 0.64 mg/dL (ref 0.44–1.00)
GFR calc Af Amer: >60 mL/min (ref >60)
Glucose, Bld: 104 mg/dL — ABNORMAL HIGH (ref 65–99)
POTASSIUM: 3.3 mmol/L — AB (ref 3.5–5.1)
SODIUM: 140 mmol/L (ref 135–145)

## 2017-09-27 LAB — MAGNESIUM: MAGNESIUM: 1.9 mg/dL (ref 1.7–2.4)

## 2017-09-27 MED ORDER — POTASSIUM CHLORIDE CRYS ER 20 MEQ PO TBCR
40.0000 meq | EXTENDED_RELEASE_TABLET | Freq: Once | ORAL | Status: AC
  Administered 2017-09-27: 40 meq via ORAL
  Filled 2017-09-27: qty 2

## 2017-09-27 NOTE — Progress Notes (Signed)
ELECTROLYTE CONSULT  Pharmacy Consult for Electrolytes Indication: hypokalemia   Labs: Sodium (mmol/L)  Date Value  09/27/2017 140   Potassium (mmol/L)  Date Value  09/27/2017 3.3 (L)   Magnesium (mg/dL)  Date Value  16/10/960411/20/2018 1.9   Calcium (mg/dL)  Date Value  54/09/811911/20/2018 8.5 (L)   Albumin (g/dL)  Date Value  14/78/295611/19/2018 3.0 (L)    Estimated Creatinine Clearance: 71.2 mL/min (by C-G formula based on SCr of 0.64 mg/dL).  Assessment: 56 yo female admitted with small bowel obstruction. AM labs showed hypokalemia with K = 3.3.  Pharmacy has been consulted to manage and replace electrolytes.  Goal of Therapy:  K = 3.5 - 5  Plan:   Patient was given potassium chloride 40mEq PO this AM after morning labs resulted. Will obtain AM labs and monitor electrolytes. Pharmacy will continue to follow and adjust as needed.  Demetrius Charityeldrin D. Horace Wishon, PharmD  09/27/2017,9:38 AM

## 2017-09-27 NOTE — Discharge Instructions (Signed)
Follow up with primary care physician in 1 week

## 2017-09-27 NOTE — Progress Notes (Signed)
CC: ileus Subjective: Feeling well, tolerated diet, + flatus, no N/V KUB p. Reviewed, improved gas pattern  Objective: Vital signs in last 24 hours: Temp:  [97.9 F (36.6 C)-99.3 F (37.4 C)] 97.9 F (36.6 C) (11/20 0814) Pulse Rate:  [91-105] 91 (11/20 0814) Resp:  [16-18] 16 (11/20 0814) BP: (110-133)/(58-71) 110/68 (11/20 0814) SpO2:  [90 %-98 %] 93 % (11/20 0814) Last BM Date: 09/23/17  Intake/Output from previous day: 11/19 0701 - 11/20 0700 In: 1398.3 [P.O.:480; I.V.:918.3] Out: -  Intake/Output this shift: No intake/output data recorded.  Physical exam: NAD Abd: soft, Nt, normal bs, no peritonitis Ext: well perfused and warm  Lab Results: CBC  Recent Labs    09/26/17 0749  WBC 6.7  HGB 11.4*  HCT 33.3*  PLT 247   BMET Recent Labs    09/26/17 0749 09/27/17 0437  NA 135 140  K 3.3* 3.3*  CL 100* 103  CO2 24 28  GLUCOSE 107* 104*  BUN 17 15  CREATININE 0.53 0.64  CALCIUM 8.2* 8.5*   PT/INR No results for input(s): LABPROT, INR in the last 72 hours. ABG No results for input(s): PHART, HCO3 in the last 72 hours.  Invalid input(s): PCO2, PO2  Studies/Results: Dg Abd Acute W/chest  Result Date: 09/26/2017 CLINICAL DATA:  Diagnosed with small bowel obstruction several days. History of an unspecified type of hernia. History of asthma -COPD, former smoker, history of rectal tumor and intestinal adhesions. EXAM: DG ABDOMEN ACUTE W/ 1V CHEST COMPARISON:  Chest x-ray of September 23, 2017 and May 03, 2017. FINDINGS: The lungs are mildly hyperinflated. The interstitial markings are coarse and more conspicuous today. The heart is normal in size. The pulmonary vascularity is mildly prominent centrally. There is no pleural effusion. There is scarring at the right lung base. Within the upper and mid abdomen there is a moderate amount of gas within colonic loops and some within small bowel. There is minimal gas and stool in the rectum. No free extraluminal gas  collections are observed. The patient has known gallstones which are not clearly evident on this study. The bony structures are unremarkable where visualized. IMPRESSION: Bowel gas pattern suggests a ileus or partial small bowel obstruction. There is no evidence of perforation. Increased pulmonary interstitial markings may reflect acute bronchitis superimposed upon COPD/asthma and smoking related changes. Electronically Signed   By: David  SwazilandJordan M.D.   On: 09/26/2017 10:39   Dg Abd Portable 2v  Result Date: 09/27/2017 CLINICAL DATA:  Small bowel obstruction EXAM: PORTABLE ABDOMEN - 2 VIEW COMPARISON:  Radiography from yesterday FINDINGS: Normalizing bowel gas pattern. There is gas now seen within nondilated small and large bowel. No evidence of pneumoperitoneum. No concerning mass effect or gas collection. Mild scarring or atelectasis at the right base. IMPRESSION: Normalized bowel gas pattern.  No persistent gas dilated bowel. Electronically Signed   By: Marnee SpringJonathon  Watts M.D.   On: 09/27/2017 08:18    Anti-infectives: Anti-infectives (From admission, onward)   None      Assessment/Plan: resolving ileus No surgical intervention DC home F/U prn D/w Dr. Amado CoeGouru in detail  Sterling Bigiego Leaner Morici, MD, [facility]  09/27/2017

## 2017-09-27 NOTE — Discharge Summary (Signed)
[facility] - [place] at [facility]   PATIENT NAME: Renee Cochran    MR#:  [id number]  DATE OF BIRTH:  [birth year]  DATE OF ADMISSION:  09/23/2017 ADMITTING PHYSICIAN: Oralia Manis, MD  DATE OF DISCHARGE: 09/27/17  PRIMARY CARE PHYSICIAN: Cain Sieve, MD    ADMISSION DIAGNOSIS:  Small bowel obstruction (HCC) [K56.609] Internal hernia [K45.8] Chest pain [R07.9]  DISCHARGE DIAGNOSIS:  Principal Problem:   SBO (small bowel obstruction) (HCC) Active Problems:   COPD, severe (HCC)   Intestinal adhesions with partial obstruction (HCC)   SECONDARY DIAGNOSIS:   Past Medical History:  Diagnosis Date  . Asthma   . Collapse of left lung   . COPD (chronic obstructive pulmonary disease) [facility])     HOSPITAL COURSE:    Renee Cochran a56 y.o.femalewho presents with epigastric abdominal pain. Patient has also had nausea without vomiting. Here in the ED she was found on imaging to have SBO, likely internal hernia.  # SBO (small bowel obstruction) (HCC) -  Surgery is recommending conservative management , discussed with Dr. Valaria Good Repeat x-ray today has revealed normalized bowel gas pattern , patient is clinically doing fine okay to discharge patient from surgical standpoint Improved with conservative management  #Chronic COPD No exacerbation.  Continue breathing treatments as needed, monitor closely  #Chronic T12 compression fracture per imaging Pain management as needed  #Hypokalemia repleted  Out of bed and ambulate the patient   DISCHARGE CONDITIONS:   stable  CONSULTS OBTAINED:  Treatment Team:  Ancil Linsey, MD   PROCEDURES  None   DRUG ALLERGIES:   Allergies  Allergen Reactions  . Basil Oil Anaphylaxis  . Garlic Anaphylaxis and Nausea And Vomiting  . Onion Anaphylaxis  . Penicillins Anaphylaxis    Has patient had a PCN reaction causing immediate rash, facial/tongue/throat swelling, SOB or  lightheadedness with hypotension: yes Has patient had a PCN reaction causing severe rash involving mucus membranes or skin necrosis: yes Has patient had a PCN reaction that required hospitalization yes Has patient had a PCN reaction occurring within the last 10 years: no If all of the above answers are "NO", then may proceed with Cephalosporin use.   . Sulfa Antibiotics Anaphylaxis    DISCHARGE MEDICATIONS:   Current Discharge Medication List    CONTINUE these medications which have NOT CHANGED   Details  acetaminophen (TYLENOL) 500 MG tablet Take 1,000 mg by mouth.    albuterol (PROVENTIL HFA;VENTOLIN HFA) 108 (90 Base) MCG/ACT inhaler Inhale 2 puffs into the lungs every 4 (four) hours as needed.    albuterol (PROVENTIL) (2.5 MG/3ML) 0.083% nebulizer solution Take 2.5 mg by nebulization every 4 (four) hours as needed for wheezing or shortness of breath.    fluticasone (FLONASE) 50 MCG/ACT nasal spray Place 1 spray into both nostrils daily. Qty: 16 g, Refills: 2    Fluticasone-Salmeterol (ADVAIR) 250-50 MCG/DOSE AEPB Inhale 1 puff 2 (two) times daily into the lungs.    gabapentin (NEURONTIN) 300 MG capsule Take 300 mg at bedtime by mouth.    ipratropium (ATROVENT) 0.02 % nebulizer solution Take 1 mcg by nebulization 3 (three) times daily.     montelukast (SINGULAIR) 10 MG tablet Take 1 tablet by mouth at bedtime.    predniSONE (STERAPRED UNI-PAK 21 TAB) 10 MG (21) TBPK tablet Taper by 10 mg daily Qty: 21 tablet, Refills: 0    tiotropium (SPIRIVA) 18 MCG inhalation capsule Place 18 mcg into inhaler and inhale daily.      STOP taking  these medications     azithromycin (ZITHROMAX) 250 MG tablet      Diclofenac Sodium 3 % GEL      fluticasone furoate-vilanterol (BREO ELLIPTA) 100-25 MCG/INH AEPB          DISCHARGE INSTRUCTIONS:   Follow-up with primary care physician in 1 week  DIET:  Regular diet  DISCHARGE CONDITION:  Stable  ACTIVITY:  Activity as  tolerated  OXYGEN:  Home Oxygen: Yes.     Oxygen Delivery: 2 liters/min via Patient connected to nasal cannula oxygen  DISCHARGE LOCATION:  home   If you experience worsening of your admission symptoms, develop shortness of breath, life threatening emergency, suicidal or homicidal thoughts you must seek medical attention immediately by calling 911 or calling your MD immediately  if symptoms less severe.  You Must read complete instructions/literature along with all the possible adverse reactions/side effects for all the Medicines you take and that have been prescribed to you. Take any new Medicines after you have completely understood and accpet all the possible adverse reactions/side effects.   Please note  You were cared for by a hospitalist during your hospital stay. If you have any questions about your discharge medications or the care you received while you were in the hospital after you are discharged, you can call the unit and asked to speak with the hospitalist on call if the hospitalist that took care of you is not available. Once you are discharged, your primary care physician will handle any further medical issues. Please note that NO REFILLS for any discharge medications will be authorized once you are discharged, as it is imperative that you return to your primary care physician (or establish a relationship with a primary care physician if you do not have one) for your aftercare needs so that they can reassess your need for medications and monitor your lab values.     Today  Chief Complaint  Patient presents with  . Chest Pain   The patient is doing fine.  Denies any abdominal pain or distention.  Tolerating diet Okay to discharge patient from surgical standpoint  ROS:  CONSTITUTIONAL: Denies fevers, chills. Denies any fatigue, weakness.  EYES: Denies blurry vision, double vision, eye pain. EARS, NOSE, THROAT: Denies tinnitus, ear pain, hearing loss. RESPIRATORY: Denies  cough, wheeze, shortness of breath.  CARDIOVASCULAR: Denies chest pain, palpitations, edema.  GASTROINTESTINAL: Denies nausea, vomiting, diarrhea, abdominal pain. Denies bright red blood per rectum. GENITOURINARY: Denies dysuria, hematuria. ENDOCRINE: Denies nocturia or thyroid problems. HEMATOLOGIC AND LYMPHATIC: Denies easy bruising or bleeding. SKIN: Denies rash or lesion. MUSCULOSKELETAL: Denies pain in neck, back, shoulder, knees, hips or arthritic symptoms.  NEUROLOGIC: Denies paralysis, paresthesias.  PSYCHIATRIC: Denies anxiety or depressive symptoms.   VITAL SIGNS:  Blood pressure 110/68, pulse 91, temperature 97.9 F (36.6 C), temperature source Oral, resp. rate 16, height 5\' 3"  (1.6 m), weight 68.4 kg (150 lb 12.8 oz), SpO2 93 %.  I/O:    Intake/Output Summary (Last 24 hours) at 09/27/2017 1017 Last data filed at 09/27/2017 0852 Gross per 24 hour  Intake 240 ml  Output 300 ml  Net -60 ml    PHYSICAL EXAMINATION:  GENERAL:  56 y.o.-year-old patient lying in the bed with no acute distress.  EYES: Pupils equal, round, reactive to light and accommodation. No scleral icterus. Extraocular muscles intact.  HEENT: Head atraumatic, normocephalic. Oropharynx and nasopharynx clear.  NECK:  Supple, no jugular venous distention. No thyroid enlargement, no tenderness.  LUNGS: Normal breath sounds bilaterally,  no wheezing, rales,rhonchi or crepitation. No use of accessory muscles of respiration.  CARDIOVASCULAR: S1, S2 normal. No murmurs, rubs, or gallops.  ABDOMEN: Soft, non-tender, non-distended. Bowel sounds present. No organomegaly or mass.  EXTREMITIES: No pedal edema, cyanosis, or clubbing.  NEUROLOGIC: Cranial nerves II through XII are intact. Muscle strength 5/5 in all extremities. Sensation intact. Gait not checked.  PSYCHIATRIC: The patient is alert and oriented x 3.  SKIN: No obvious rash, lesion, or ulcer.   DATA REVIEW:   CBC Recent Labs  Lab 09/26/17 0749  WBC  6.7  HGB 11.4*  HCT 33.3*  PLT 247    Chemistries  Recent Labs  Lab 09/26/17 0749 09/27/17 0437  NA 135 140  K 3.3* 3.3*  CL 100* 103  CO2 24 28  GLUCOSE 107* 104*  BUN 17 15  CREATININE 0.53 0.64  CALCIUM 8.2* 8.5*  MG  --  1.9  AST 17  --   ALT 13*  --   ALKPHOS 54  --   BILITOT 0.9  --     Cardiac Enzymes Recent Labs  Lab 09/23/17 2057  TROPONINI <0.03    Microbiology Results  Results for orders placed or performed during the hospital encounter of 05/03/17  MRSA PCR Screening     Status: None   Collection Time: 05/03/17  2:10 AM  Result Value Ref Range Status   MRSA by PCR NEGATIVE NEGATIVE Final    Comment:        The GeneXpert MRSA Assay (FDA approved for NASAL specimens only), is one component of a comprehensive MRSA colonization surveillance program. It is not intended to diagnose MRSA infection nor to guide or monitor treatment for MRSA infections.   Culture, expectorated sputum-assessment     Status: None   Collection Time: 05/05/17 11:12 AM  Result Value Ref Range Status   Specimen Description EXPECTORATED SPUTUM  Final   Special Requests NONE  Final   Sputum evaluation THIS SPECIMEN IS ACCEPTABLE FOR SPUTUM CULTURE  Final   Report Status 05/05/2017 FINAL  Final  Culture, respiratory (NON-Expectorated)     Status: Abnormal   Collection Time: 05/05/17 11:12 AM  Result Value Ref Range Status   Specimen Description EXPECTORATED SPUTUM  Final   Special Requests NONE Reflexed from [id number]  Final   Gram Stain   Final    ABUNDANT WBC PRESENT, PREDOMINANTLY PMN ABUNDANT GRAM POSITIVE RODS RARE GRAM POSITIVE COCCI IN PAIRS RARE GRAM NEGATIVE RODS RARE YEAST Performed at [facility], [address], [place], [place] [address]    Culture MULTIPLE ORGANISMS PRESENT, NONE PREDOMINANT (A)  Final   Report Status 05/07/2017 FINAL  Final    RADIOLOGY:  Ct Abdomen Pelvis W Contrast  Result Date: 09/23/2017 CLINICAL DATA:  Generalized chest,  abdominal pain radiating to back. Shortness of breath. Assess for abdominal infection. EXAM: CT ABDOMEN AND PELVIS WITH CONTRAST TECHNIQUE: Multidetector CT imaging of the abdomen and pelvis was performed using the standard protocol following bolus administration of intravenous contrast. CONTRAST:  ISOVUE-300 IOPAMIDOL (ISOVUE-300) INJECTION 61% COMPARISON:  CT abdomen and pelvis October 25, 2016 FINDINGS: LOWER CHEST: In subpleural atelectasis. No pleural effusion or focal consolidation. Included heart size is normal. No pericardial effusion. HEPATOBILIARY: Tiny calcified gallstone without CT findings of acute cholecystitis. PANCREAS: Normal. SPLEEN: Normal. ADRENALS/URINARY TRACT: Kidneys are orthotopic, demonstrating symmetric enhancement. No nephrolithiasis, hydronephrosis or solid renal masses. The unopacified ureters are normal in course and caliber. Delayed imaging through the kidneys demonstrates symmetric  prompt contrast excretion within the proximal urinary collecting system. Urinary bladder is partially distended and unremarkable. 11 mm LEFT adrenal nodule, -17 Hounsfield units on prior noncontrast CT consistent with benign adrenal adenoma. STOMACH/BOWEL: The stomach, large bowel are normal in course and caliber without inflammatory changes. Multiple loops of fluid-filled small bowel in LEFT upper quadrant with mild distention less than 3 cm. 2 adjacent small bowel transition points (coronal 53/104). Predominately deep compress: VASCULAR/LYMPHATIC: Aortoiliac vessels are normal in course and caliber. Mild calcific atherosclerosis. No lymphadenopathy by CT size criteria. REPRODUCTIVE: Normal. OTHER: No intraperitoneal free fluid or free air. Mild rectus abdominis diastases with small fat containing umbilical hernia. MUSCULOSKELETAL: Nonacute. Osteopenia. Moderate T12 compression fracture, progressed height loss from 2017. IMPRESSION: 1. Low-grade small obstruction with suspected internal hernia. 2.  Cholelithiasis without acute cholecystitis. 3. Similar bibasilar bronchiectasis with subpleural atelectasis. Recommend CT chest on a nonemergent basis to evaluate extent of disease. 4. Osteopenia, moderate T12 compression fracture, progressed height loss from 2017. Aortic Atherosclerosis (ICD10-I70.0). Electronically Signed   By: Awilda Metro M.D.   On: 09/23/2017 22:29   Dg Chest Port 1 View  Result Date: 09/23/2017 CLINICAL DATA:  Chest pain EXAM: PORTABLE CHEST 1 VIEW COMPARISON:  06/29/2017 FINDINGS: Linear scarring or atelectasis at the bases. No focal consolidation or effusion. Normal heart size. No pneumothorax. IMPRESSION: Linear atelectasis or scarring at the bases. Electronically Signed   By: Jasmine Pang M.D.   On: 09/23/2017 21:37   Dg Abd Acute W/chest  Result Date: 09/26/2017 CLINICAL DATA:  Diagnosed with small bowel obstruction several days. History of an unspecified type of hernia. History of asthma -COPD, former smoker, history of rectal tumor and intestinal adhesions. EXAM: DG ABDOMEN ACUTE W/ 1V CHEST COMPARISON:  Chest x-ray of September 23, 2017 and May 03, 2017. FINDINGS: The lungs are mildly hyperinflated. The interstitial markings are coarse and more conspicuous today. The heart is normal in size. The pulmonary vascularity is mildly prominent centrally. There is no pleural effusion. There is scarring at the right lung base. Within the upper and mid abdomen there is a moderate amount of gas within colonic loops and some within small bowel. There is minimal gas and stool in the rectum. No free extraluminal gas collections are observed. The patient has known gallstones which are not clearly evident on this study. The bony structures are unremarkable where visualized. IMPRESSION: Bowel gas pattern suggests a ileus or partial small bowel obstruction. There is no evidence of perforation. Increased pulmonary interstitial markings may reflect acute bronchitis superimposed upon  COPD/asthma and smoking related changes. Electronically Signed   By: David  Swaziland M.D.   On: 09/26/2017 10:39   Dg Abd Portable 1 View  Result Date: 09/24/2017 CLINICAL DATA:  56 year old female status post enteric tube placement. EXAM: PORTABLE ABDOMEN - 1 VIEW COMPARISON:  Abdominal CT dated 09/23/2017 FINDINGS: Evaluation is limited due to patient's body habitus and overlying soft tissues. An enteric tube is noted with side-port just distal to the gastroesophageal junction and tip in the proximal stomach. Recommend further advancing of the tube into the stomach. Bibasilar streaky atelectatic changes noted. IMPRESSION: Partially visualized enteric tube in the proximal stomach. Recommend further advancing the tube into the stomach. Electronically Signed   By: Elgie Collard M.D.   On: 09/24/2017 00:11   Dg Abd Portable 2v  Result Date: 09/27/2017 CLINICAL DATA:  Small bowel obstruction EXAM: PORTABLE ABDOMEN - 2 VIEW COMPARISON:  Radiography from yesterday FINDINGS: Normalizing bowel gas pattern. There is  gas now seen within nondilated small and large bowel. No evidence of pneumoperitoneum. No concerning mass effect or gas collection. Mild scarring or atelectasis at the right base. IMPRESSION: Normalized bowel gas pattern.  No persistent gas dilated bowel. Electronically Signed   By: Marnee SpringJonathon  Watts M.D.   On: 09/27/2017 08:18    EKG:   Orders placed or performed during the hospital encounter of 09/23/17  . EKG 12-Lead  . EKG 12-Lead  . ED EKG within 10 minutes  . ED EKG within 10 minutes      Management plans discussed with the patient, family and they are in agreement.  CODE STATUS:     Code Status Orders  (From admission, onward)        Start     Ordered   09/24/17 0233  Full code  Continuous     09/24/17 0232    Code Status History    Date Active Date Inactive Code Status Order ID Comments User Context   06/29/2017 23:06 07/01/2017 14:16 Full Code [id number]  Oralia ManisWillis,  David, MD Inpatient   05/03/2017 03:14 05/06/2017 18:18 Full Code [id number]  Ihor AustinPyreddy, Pavan, MD Inpatient   05/03/2017 03:14 05/03/2017 03:14 Full Code [id number]  Ricarda FrameWoodham, Charles, MD Inpatient      TOTAL TIME TAKING CARE OF THIS PATIENT: 43 minutes.   Note: This dictation was prepared with Dragon dictation along with smaller phrase technology. Any transcriptional errors that result from this process are unintentional.   @[facility] @  on 09/27/2017 at 10:17 AM  Between 7am to 6pm - Pager - [phone]  After 6pm go to [email or web address] - password [email or web address] [email or web address]  [facility]  Office  [phone]  CC: Primary care physician; Cain SieveKlipstein, Christopher, MD

## 2017-09-27 NOTE — Progress Notes (Signed)
Renee Cochran to be D/C'd Home per MD order.  Discussed with the patient and all questions fully answered.  VSS, Skin clean, dry and intact without evidence of skin break down, no evidence of skin tears noted. IV catheter discontinued intact. Site without signs and symptoms of complications. Dressing and pressure applied.  An After Visit Summary was printed and given to the patient. Patient received prescription.  D/c education completed with patient/family including follow up instructions, medication list, d/c activities limitations if indicated, with other d/c instructions as indicated by MD - patient able to verbalize understanding, all questions fully answered.   Patient instructed to return to ED, call 911, or call MD for any changes in condition.   Patient escorted via WC, and D/C home via private auto.  Renee Cochran 09/27/2017 10:57 AM

## 2018-02-23 IMAGING — CT CT ABD-PELV W/ CM
2 of 5 series · 16 of 46 positions shown, 18 images · IV contrast (APPLIED)
Comparison: CT abdomen and pelvis October 25, 2016

CLINICAL DATA: Generalized chest, abdominal pain radiating to back.
Shortness of breath. Assess for abdominal infection.

EXAM:
CT ABDOMEN AND PELVIS WITH CONTRAST
TECHNIQUE: Multidetector CT imaging of the abdomen and pelvis was performed
using the standard protocol following bolus administration of
intravenous contrast.
CONTRAST:  100mL [id number]-455 IOPAMIDOL (EDA8OI-455) INJECTION 61%

[Series 4: routine abd/pel with · axial · 0.91mm/px · z∈[-512,-172]mm · 13 of 78 slices shown, 15 images]
[im 5/78  soft-tissue]
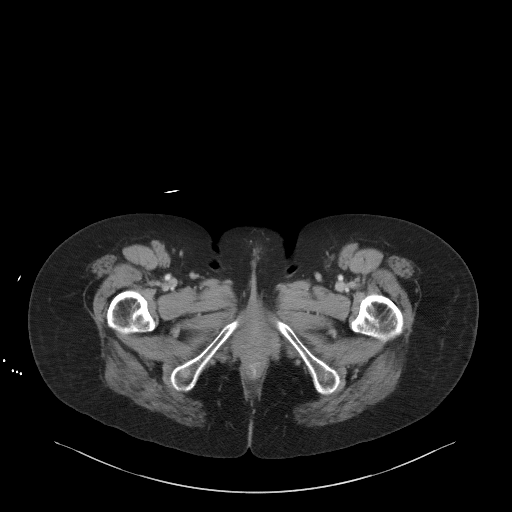
[im 5/78  bone]
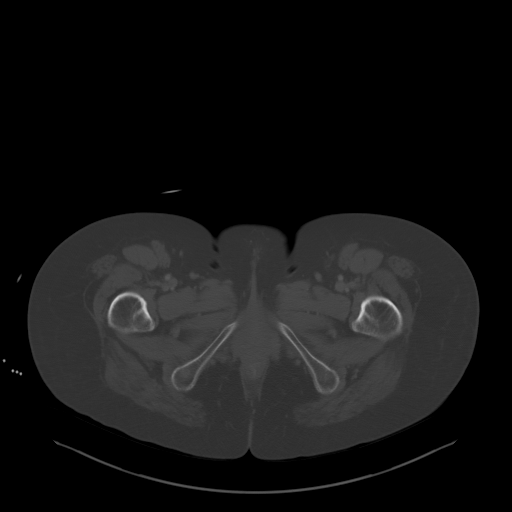
[im 13/78  soft-tissue]
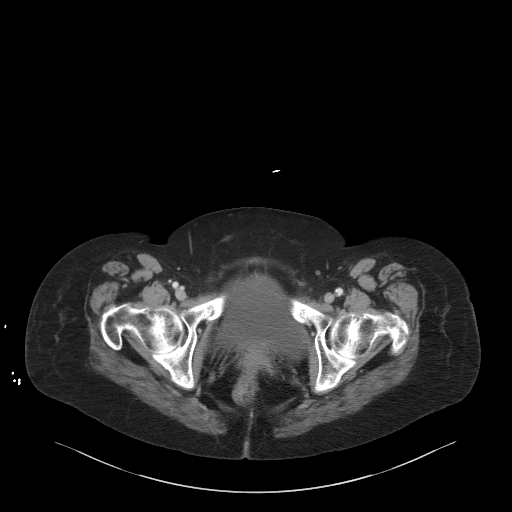
[im 17/78  soft-tissue]
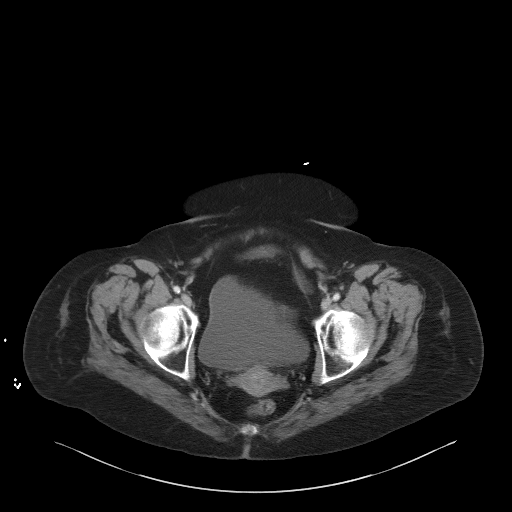
[im 21/78  soft-tissue]
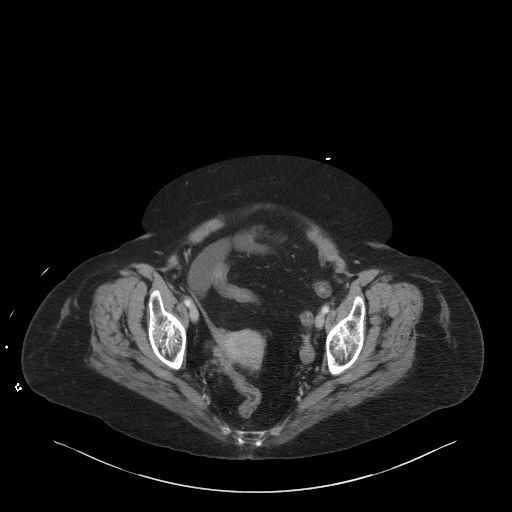
[im 29/78  soft-tissue]
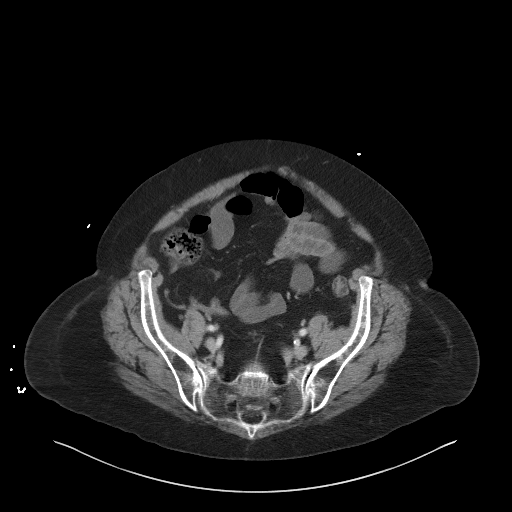
[im 33/78  soft-tissue]
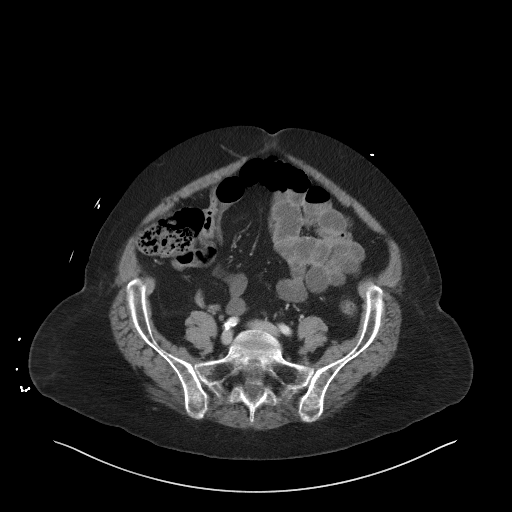
[im 41/78  soft-tissue]
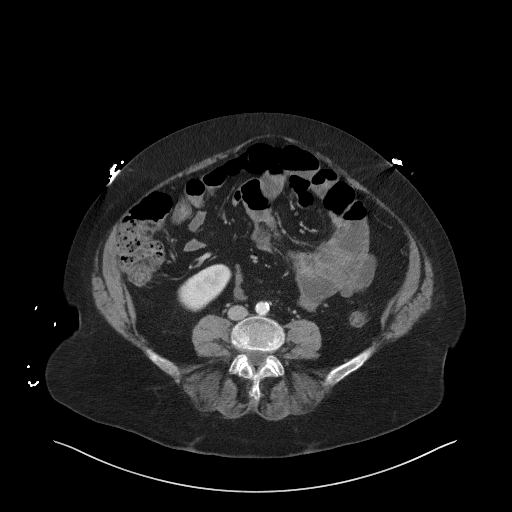
[im 45/78  soft-tissue]
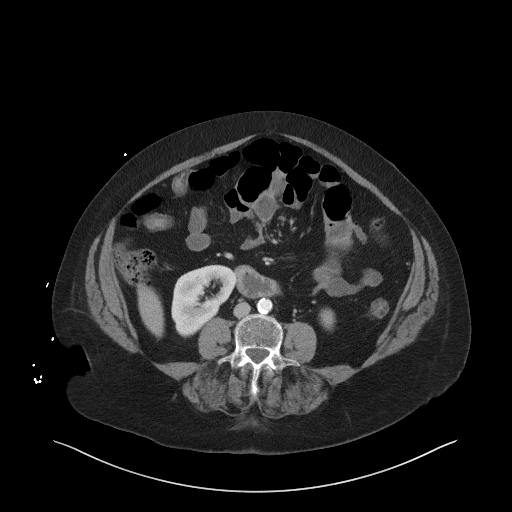
[im 49/78  soft-tissue]
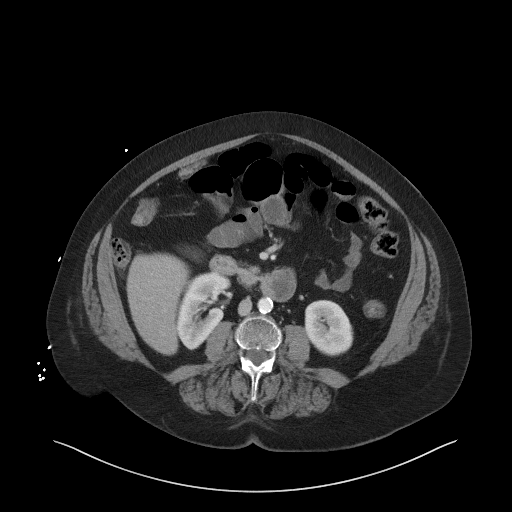
[im 49/78  bone]
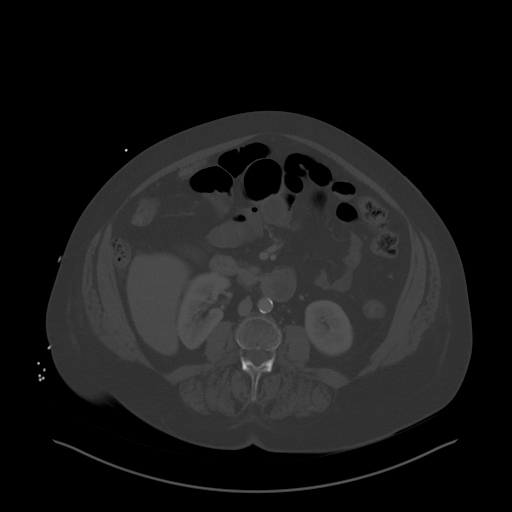
[im 57/78  soft-tissue]
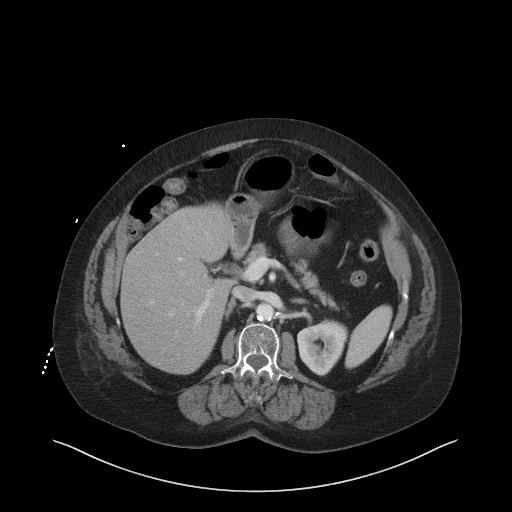
[im 61/78  soft-tissue]
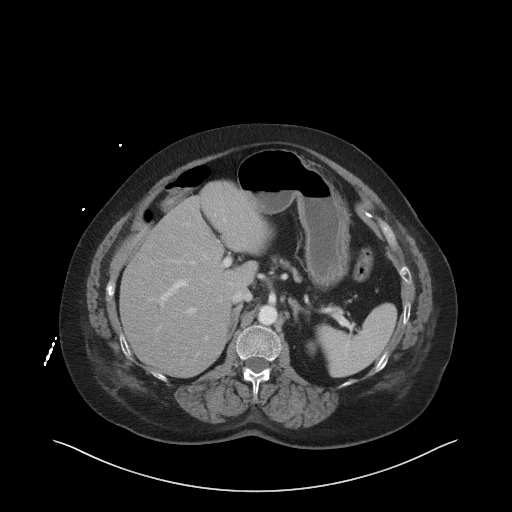
[im 65/78  soft-tissue]
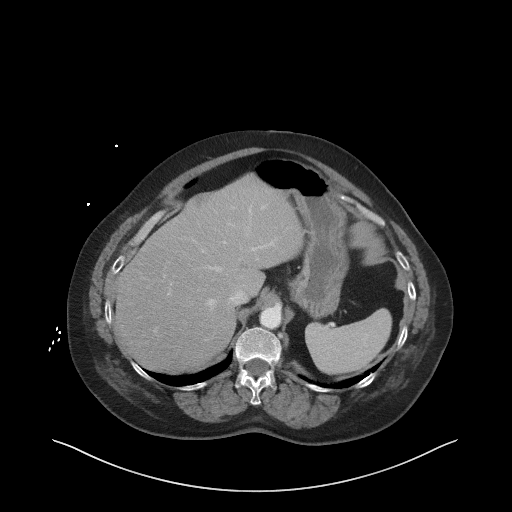
[im 73/78  soft-tissue]
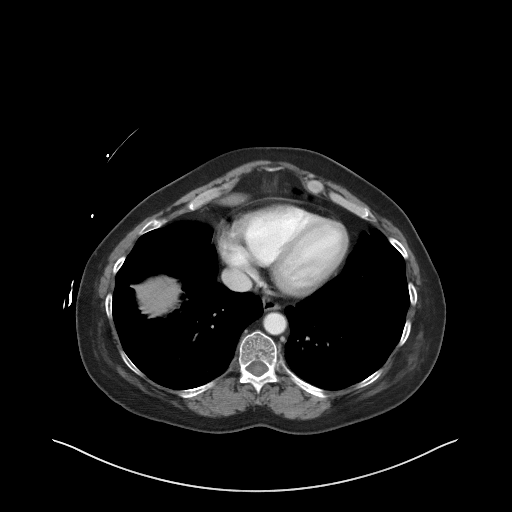

[Series 7: coronal st · coronal · 0.76mm/px · 3 of 104 slices shown]
[im 35/104  soft-tissue]
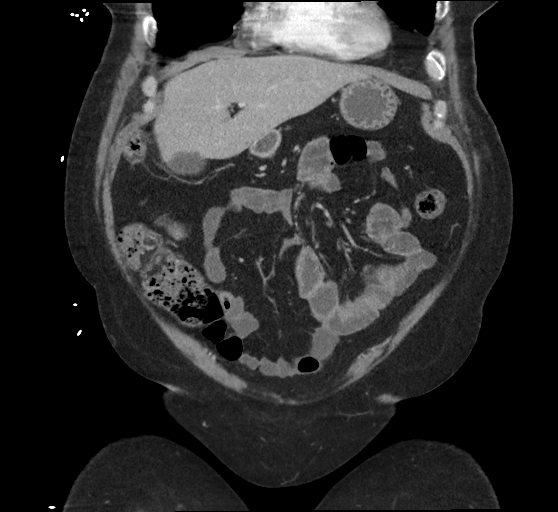
[im 46/104  soft-tissue]
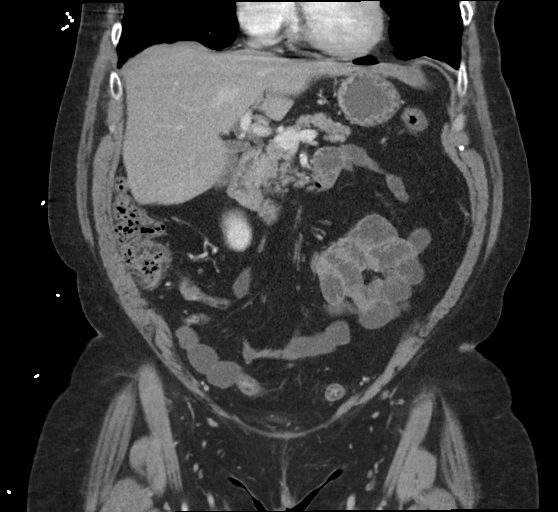
[im 58/104  soft-tissue]
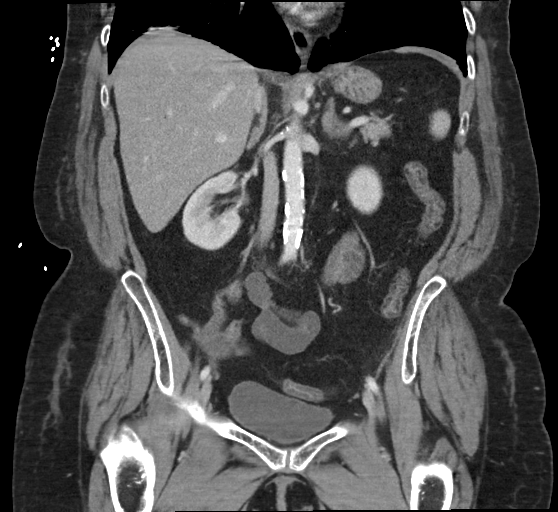

[16 of 46 positions shown; findings below may reference images not displayed]

FINDINGS: LOWER CHEST: In subpleural atelectasis. No pleural effusion or focal
consolidation. Included heart size is normal. No pericardial
effusion.

HEPATOBILIARY: Tiny calcified gallstone without CT findings of acute
cholecystitis.

PANCREAS: Normal.

SPLEEN: Normal.

ADRENALS/URINARY TRACT: Kidneys are orthotopic, demonstrating
symmetric enhancement. No nephrolithiasis, hydronephrosis or solid
renal masses. The unopacified ureters are normal in course and
caliber. Delayed imaging through the kidneys demonstrates symmetric
prompt contrast excretion within the proximal urinary collecting
system. Urinary bladder is partially distended and unremarkable. 11
mm LEFT adrenal nodule, -17 Hounsfield units on prior noncontrast CT
consistent with benign adrenal adenoma.

STOMACH/BOWEL: The stomach, large bowel are normal in course and
caliber without inflammatory changes. Multiple loops of fluid-filled
small bowel in LEFT upper quadrant with mild distention less than 3
cm. 2 adjacent small bowel transition points (coronal 53/104).
Predominately deep compress:

VASCULAR/LYMPHATIC: Aortoiliac vessels are normal in course and
caliber. Mild calcific atherosclerosis. No lymphadenopathy by CT
size criteria.

REPRODUCTIVE: Normal.

OTHER: No intraperitoneal free fluid or free air. Mild rectus
abdominis diastases with small fat containing umbilical hernia.

MUSCULOSKELETAL: Nonacute. Osteopenia. Moderate T12 compression
fracture, progressed height loss from 1860.
IMPRESSION: 1. Low-grade small obstruction with suspected internal hernia.
2. Cholelithiasis without acute cholecystitis.
3. Similar bibasilar bronchiectasis with subpleural atelectasis.
Recommend CT chest on a nonemergent basis to evaluate extent of
disease.
4. Osteopenia, moderate T12 compression fracture, progressed height
loss from 1860.
Aortic Atherosclerosis (CILWS-8N6.6).

## 2018-03-01 ENCOUNTER — Emergency Department
Admission: EM | Admit: 2018-03-01 | Discharge: 2018-03-01 | Disposition: A | Discharge status: Home / Self Care | Payer: Medicare Other | Attending: Emergency Medicine | Admitting: Emergency Medicine

## 2018-03-01 ENCOUNTER — Emergency Department: Payer: Medicare Other

## 2018-03-01 ENCOUNTER — Other Ambulatory Visit: Payer: Self-pay

## 2018-03-01 DIAGNOSIS — R0602 Shortness of breath: Secondary | ICD-10-CM | POA: Diagnosis present

## 2018-03-01 DIAGNOSIS — F17210 Nicotine dependence, cigarettes, uncomplicated: Secondary | ICD-10-CM | POA: Diagnosis not present

## 2018-03-01 DIAGNOSIS — Z79899 Other long term (current) drug therapy: Secondary | ICD-10-CM | POA: Diagnosis not present

## 2018-03-01 DIAGNOSIS — J441 Chronic obstructive pulmonary disease with (acute) exacerbation: Secondary | ICD-10-CM | POA: Diagnosis not present

## 2018-03-01 LAB — TROPONIN I: Troponin I: <0.03 ng/mL (ref <0.03)

## 2018-03-01 LAB — CBC
HEMATOCRIT: 40.1 % (ref 35.0–47.0)
HEMOGLOBIN: 13.4 g/dL (ref 12.0–16.0)
MCH: 33.7 pg (ref 26.0–34.0)
MCHC: 33.3 g/dL (ref 32.0–36.0)
MCV: 101.1 fL — ABNORMAL HIGH (ref 80.0–100.0)
Platelets: 378 10*3/uL (ref 150–440)
RBC: 3.96 MIL/uL (ref 3.80–5.20)
RDW: 14 % (ref 11.5–14.5)
WBC: 11.5 10*3/uL — ABNORMAL HIGH (ref 3.6–11.0)

## 2018-03-01 LAB — BASIC METABOLIC PANEL
ANION GAP: 7 (ref 5–15)
BUN: 15 mg/dL (ref 6–20)
CO2: 28 mmol/L (ref 22–32)
Calcium: 9.2 mg/dL (ref 8.9–10.3)
Chloride: 101 mmol/L (ref 101–111)
Creatinine, Ser: 0.6 mg/dL (ref 0.44–1.00)
GFR calc Af Amer: >60 mL/min (ref >60)
GLUCOSE: 165 mg/dL — AB (ref 65–99)
POTASSIUM: 4.6 mmol/L (ref 3.5–5.1)
Sodium: 136 mmol/L (ref 135–145)

## 2018-03-01 MED ORDER — AZITHROMYCIN 250 MG PO TABS: ORAL_TABLET | ORAL | 0 refills | Status: AC

## 2018-03-01 MED ORDER — ACETAMINOPHEN 325 MG PO TABS
650.0000 mg | ORAL_TABLET | Freq: Once | ORAL | Status: AC
  Administered 2018-03-01: 650 mg via ORAL
  Filled 2018-03-01: qty 2

## 2018-03-01 MED ORDER — AZITHROMYCIN 500 MG PO TABS
500.0000 mg | ORAL_TABLET | Freq: Once | ORAL | Status: AC
  Administered 2018-03-01: 500 mg via ORAL
  Filled 2018-03-01: qty 1

## 2018-03-01 MED ORDER — IPRATROPIUM-ALBUTEROL 0.5-2.5 (3) MG/3ML IN SOLN
3.0000 mL | Freq: Once | RESPIRATORY_TRACT | Status: AC
  Administered 2018-03-01: 3 mL via RESPIRATORY_TRACT
  Filled 2018-03-01: qty 3

## 2018-03-01 MED ORDER — METHYLPREDNISOLONE SODIUM SUCC 125 MG IJ SOLR
125.0000 mg | Freq: Once | INTRAMUSCULAR | Status: AC
Start: 2018-03-01 — End: 2018-03-01
  Administered 2018-03-01: 125 mg via INTRAVENOUS
  Filled 2018-03-01: qty 2

## 2018-03-01 NOTE — ED Notes (Signed)
Pt ambulated with tech [place] and O2 dropped to 88%.

## 2018-03-01 NOTE — ED Triage Notes (Addendum)
Pt comes in c/o shortness of breath. Pt took antibiotics and steroids yesterday that did not help. Pt c/o of heavy pressure to left chest with a cough. Pt wears 2L BNC at home. COPD and asthma pt. No distress noted at this time.

## 2018-03-01 NOTE — ED Provider Notes (Signed)
[facility] Emergency Department Provider Note   ____________________________________________   First MD Initiated Contact with Patient 03/01/18 1812     (approximate)  I have reviewed the triage vital signs and the nursing notes.   HISTORY  Chief Complaint Shortness of Breath    HPI Renee Cochran is a 57 y.o. female complaining of shortness of breath productive cough which is productive of thick greenish-white phlegm chest pressure which is worse with deep breathing and coughing and shortness of breath.  COPD asthma left lung collapse this seems like COPD to her.  She is on chronic 2 L of home oxygen and here in the emergency room her oxygen saturations are 98 200 on 2 L while she is lying still.  She had seen her doctor and was put on prednisone taper and she has Spiriva Singulair Atrovent Advair and Proventil inhalers and nebulizers.   Past Medical History:  Diagnosis Date  . Asthma   . Collapse of left lung   . COPD (chronic obstructive pulmonary disease) [facility])     Patient Active Problem List   Diagnosis Date Noted  . Intestinal adhesions with partial obstruction (HCC)   . SBO (small bowel obstruction) (HCC) 09/24/2017  . COPD, severe (HCC) 09/24/2017  . COPD with acute exacerbation (HCC) 06/29/2017  . Pneumothorax on left 05/03/2017  . Respiratory failure (HCC) 05/03/2017    Past Surgical History:  Procedure Laterality Date  . CESAREAN SECTION     x 2  . CHEST TUBE INSERTION    . RECTAL SURGERY     for tumor as a child    Prior to Admission medications   Medication Sig Start Date End Date Taking? Authorizing Provider  acetaminophen (TYLENOL) 500 MG tablet Take 1,000 mg by mouth.    [provider]  albuterol (PROVENTIL HFA;VENTOLIN HFA) 108 (90 Base) MCG/ACT inhaler Inhale 2 puffs into the lungs every 4 (four) hours as needed. 04/16/16   [provider]  albuterol (PROVENTIL) (2.5 MG/3ML) 0.083% nebulizer solution  Take 2.5 mg by nebulization every 4 (four) hours as needed for wheezing or shortness of breath.    [provider]  azithromycin (ZITHROMAX Z-PAK) 250 MG tablet Take 2 tablets (500 mg) on  Day 1,  followed by 1 tablet (250 mg) once daily on Days 2 through 5. 03/01/18 03/06/18  Arnaldo Natal, MD  fluticasone (FLONASE) 50 MCG/ACT nasal spray Place 1 spray into both nostrils daily. 05/07/17   Katha Hamming, MD  Fluticasone-Salmeterol (ADVAIR) 250-50 MCG/DOSE AEPB Inhale 1 puff 2 (two) times daily into the lungs.    [provider]  gabapentin (NEURONTIN) 300 MG capsule Take 300 mg at bedtime by mouth. 07/08/17   [provider]  ipratropium (ATROVENT) 0.02 % nebulizer solution Take 1 mcg by nebulization 3 (three) times daily.  10/14/16   [provider]  montelukast (SINGULAIR) 10 MG tablet Take 1 tablet by mouth at bedtime. 09/27/16   [provider]  predniSONE (STERAPRED UNI-PAK 21 TAB) 10 MG (21) TBPK tablet Taper by 10 mg daily 07/01/17   Milagros Loll, MD  tiotropium (SPIRIVA) 18 MCG inhalation capsule Place 18 mcg into inhaler and inhale daily.    [provider]    Allergies Basil oil; Garlic; Onion; Penicillins; and Sulfa antibiotics  Family History  Problem Relation Age of Onset  . Colon cancer Father   . Colon cancer Paternal Grandfather   . Diabetes Neg Hx   . Hypertension Neg Hx  Social History Social History   Tobacco Use  . Smoking status: Current Some Day Smoker    Packs/day: 0.50    Types: Cigarettes  . Smokeless tobacco: Never Used  Substance Use Topics  . Alcohol use: No  . Drug use: No    Review of Systems  Constitutional: No fever/chills Eyes: No visual changes. ENT: No sore throat. Cardiovascular: See HPI Respiratory: The HPI Gastrointestinal: No abdominal pain.  No nausea, no vomiting.  No diarrhea.  No constipation. Genitourinary: Negative for dysuria. Musculoskeletal: Negative for back  pain. Skin: Negative for rash. Neurological: Negative for headaches, focal weakness   ____________________________________________   PHYSICAL EXAM:  VITAL SIGNS: ED Triage Vitals  Enc Vitals Group     BP 03/01/18 1531 135/78     Pulse Rate 03/01/18 1531 98     Resp 03/01/18 1531 17     Temp 03/01/18 1531 98.2 F (36.8 C)     Temp Source 03/01/18 1531 Oral     SpO2 03/01/18 1531 96 %     Weight --      Height --      Head Circumference --      Peak Flow --      Pain Score 03/01/18 1408 7     Pain Loc --      Pain Edu? --      Excl. in GC? --     Constitutional: Alert and oriented.  Chronically ill-appearing patient with cough productive of very thick greenish-white phlegm Eyes: Conjunctivae are normal.  Head: Atraumatic. Nose: No congestion/rhinnorhea. Mouth/Throat: Mucous membranes are moist.  Oropharynx non-erythematous. Neck: No stridor. Cardiovascular: Normal rate, regular rhythm. Grossly normal heart sounds.  Good peripheral circulation. Respiratory: Normal respiratory effort.  No retractions. Lungs wheezes scattered throughout Gastrointestinal: Soft and nontender. No distention. No abdominal bruits. No CVA tenderness. Musculoskeletal: No lower extremity tenderness nor edema.  No joint effusions. Neurologic:  Normal speech and language. No gross focal neurologic deficits are appreciated.  Skin:  Skin is warm, dry and intact.  Psychiatric: Mood and affect are normal. Speech and behavior are normal.  ____________________________________________   LABS (all labs ordered are listed, but only abnormal results are displayed)  Labs Reviewed  BASIC METABOLIC PANEL - Abnormal; Notable for the following components:      Result Value   Glucose, Bld 165 (*)    All other components within normal limits  CBC - Abnormal; Notable for the following components:   WBC 11.5 (*)    MCV 101.1 (*)    All other components within normal limits  TROPONIN I    ____________________________________________  EKG  EKG read and interpreted by me shows sinus tachycardia rate of 106 normal axis no acute ST-T wave changes are visible the baseline is somewhat irregular because of artifact. ____________________________________________  RADIOLOGY  ED MD interpretation: X-ray reviewed by me shows low lung volumes  Official radiology report(s): Dg Chest 2 View  Result Date: 03/01/2018 CLINICAL DATA:  LEFT-sided chest pressure. EXAM: CHEST - 2 VIEW COMPARISON:  09/26/2017. FINDINGS: Normal cardiomediastinal silhouette. Thoracic atherosclerosis. Bibasilar scarring, with low lung volumes. No consolidation or edema. Old rib fractures. IMPRESSION: Low lung volumes.  Otherwise stable chest. Electronically Signed   By: Elsie Stain M.D.   On: 03/01/2018 14:54    ____________________________________________   PROCEDURES  Procedure(s) performed:   Procedures  Critical Care performed:   ____________________________________________   INITIAL IMPRESSION / ASSESSMENT AND PLAN / ED COURSE  Discharge patient is feeling better  fact feeling well her O2 sats are at baseline even when she walks.  I will give her Zithromax she will go home         ____________________________________________   FINAL CLINICAL IMPRESSION(S) / ED DIAGNOSES  Final diagnoses:  COPD exacerbation [facility](HCC)     ED Discharge Orders        Ordered    azithromycin (ZITHROMAX Z-PAK) 250 MG tablet     03/01/18 2208       Note:  This document was prepared using Dragon voice recognition software and may include unintentional dictation errors.    Arnaldo NatalMalinda, Lenah Messenger F, MD 03/01/18 2212

## 2018-03-01 NOTE — ED Notes (Signed)
Provided cup of ice to pt.

## 2018-03-01 NOTE — Discharge Instructions (Signed)
Please continue to use your inhalers and your prednisone.  Add the Zithromax 2 pills tomorrow and 1 pill every day after that.  I gave you a dose here in the emergency room as well so you should be covered till tomorrow.  Please return if you are worse especially increasing fever shortness of breath or feeling sicker.

## 2018-08-01 IMAGING — CR DG CHEST 2V
1 series · 2 of 2 positions shown · non-contrast
Comparison: 09/26/2017.

CLINICAL DATA: LEFT-sided chest pressure.

EXAM:
CHEST - 2 VIEW

[Series 1: w chest pa · 0.14mm/px · 2 of 2 slices shown]
[im 1/2]
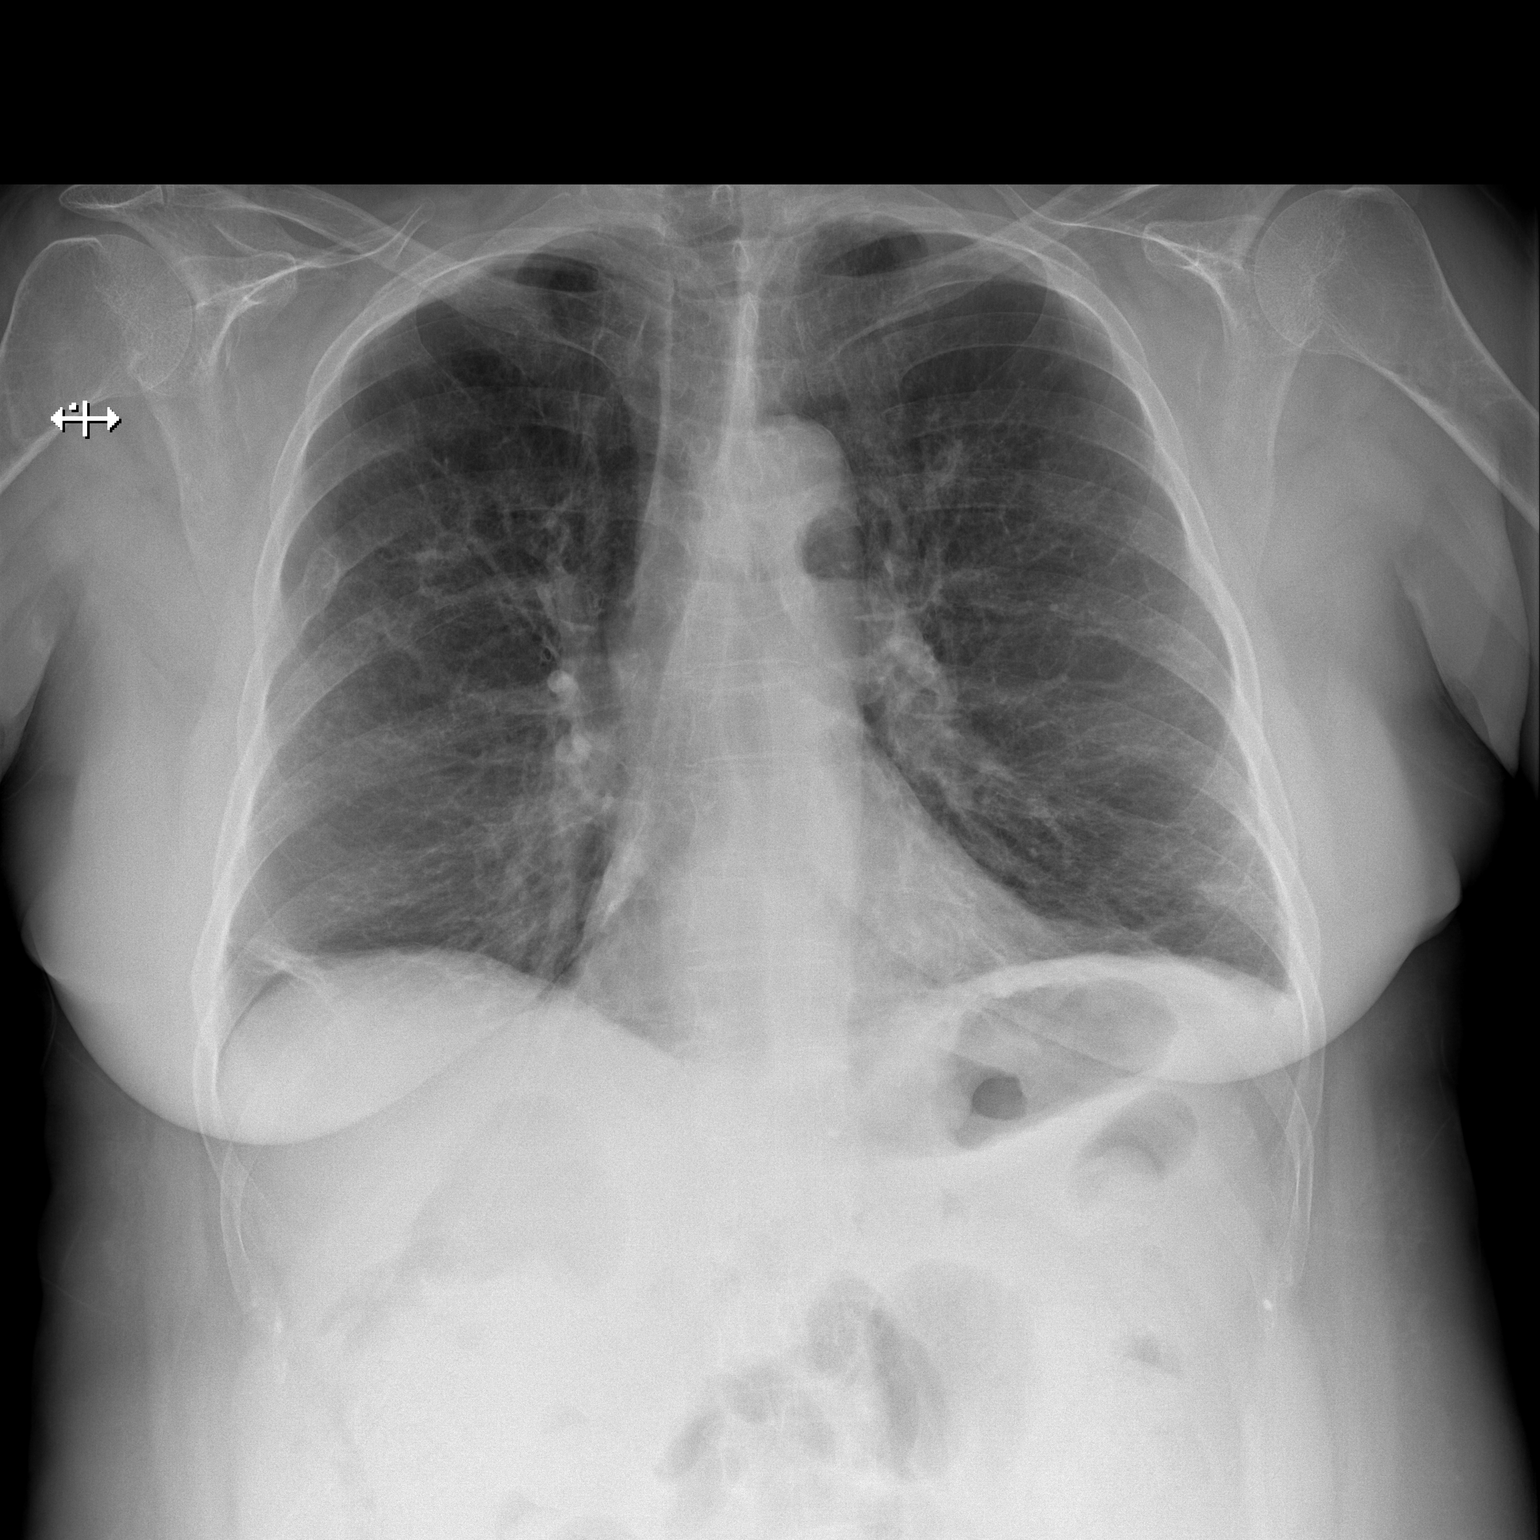
[im 2/2]
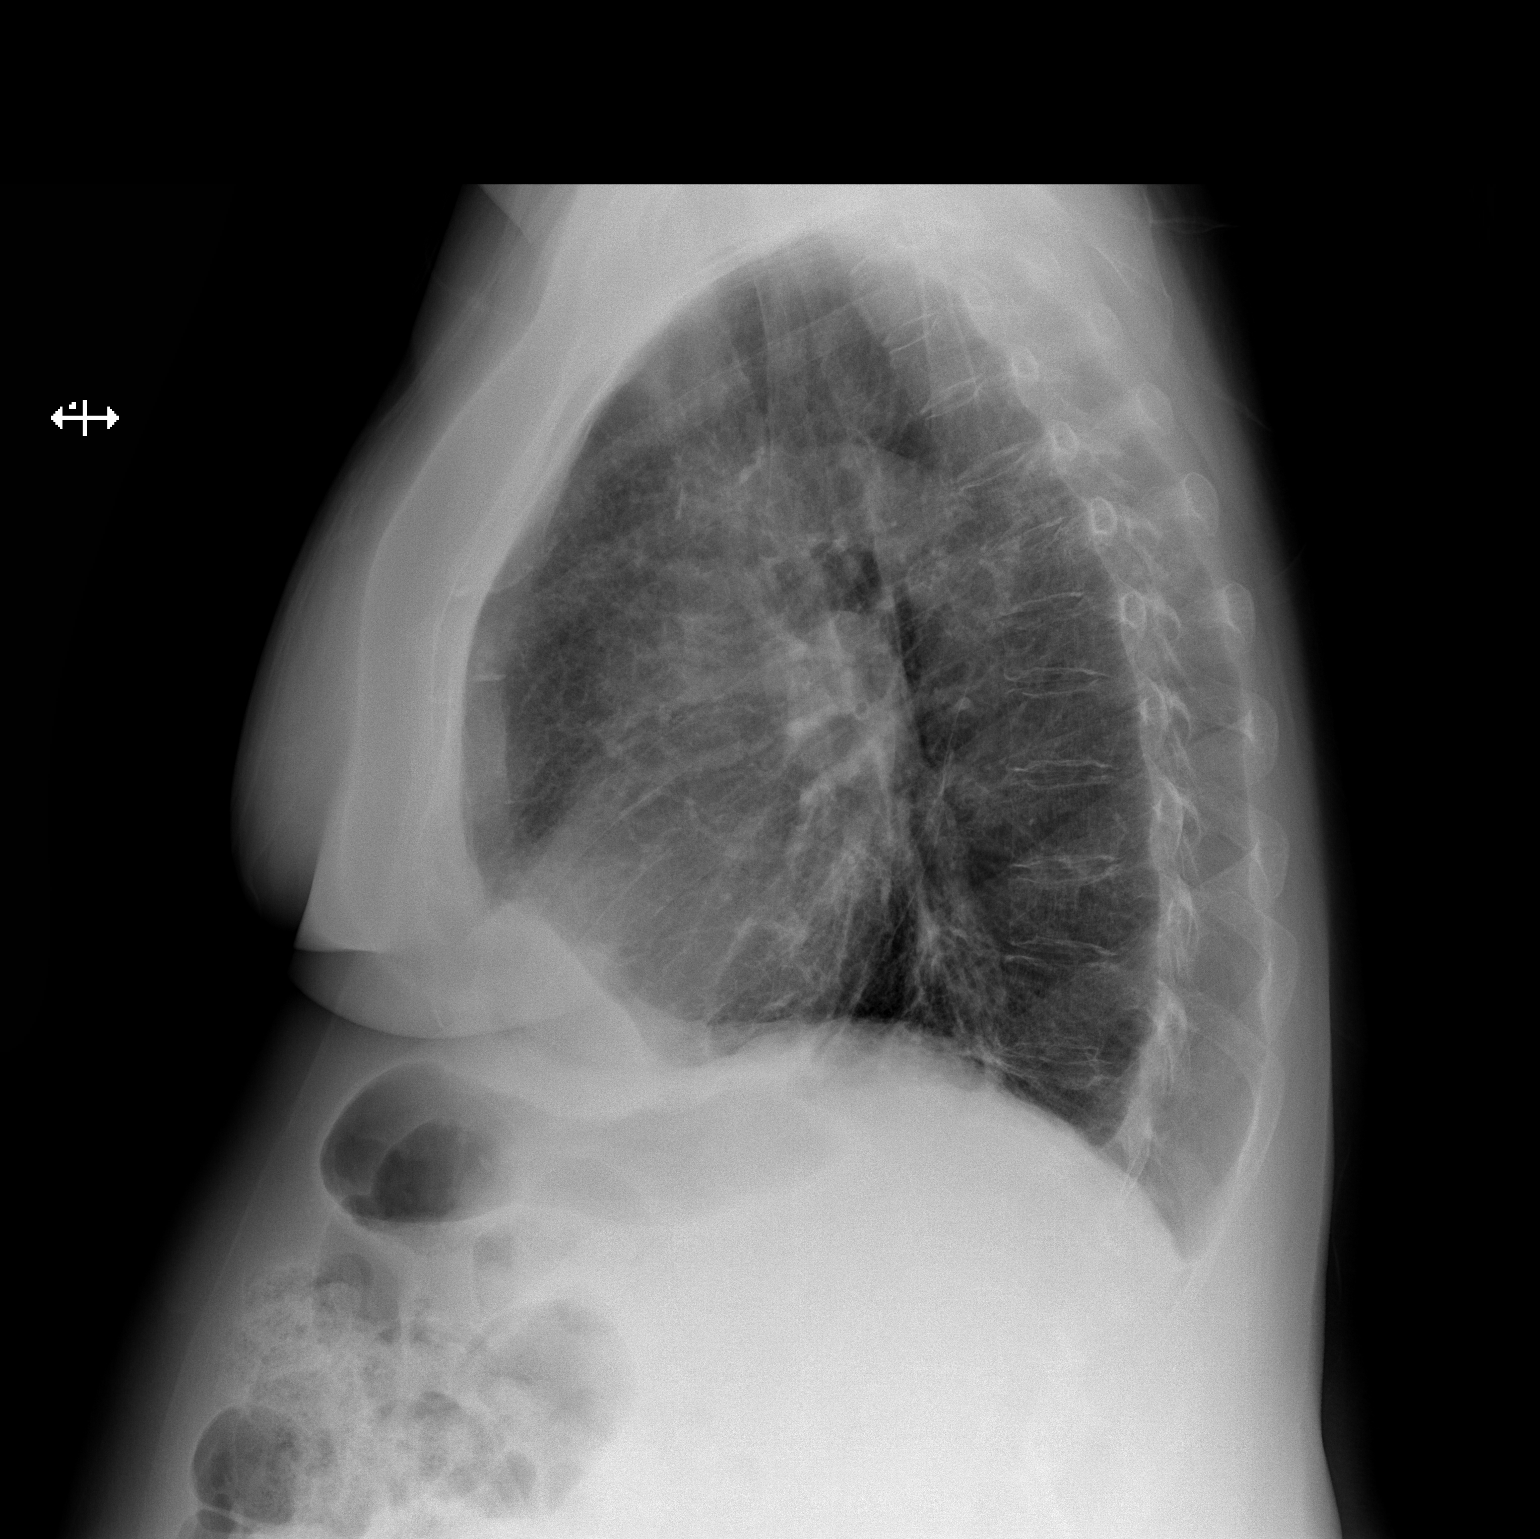

[2 of 2 positions shown; findings below may reference images not displayed]

FINDINGS: Normal cardiomediastinal silhouette. Thoracic atherosclerosis.
Bibasilar scarring, with low lung volumes. No consolidation or
edema. Old rib fractures.
IMPRESSION: Low lung volumes.  Otherwise stable chest.

## 2018-09-19 ENCOUNTER — Ambulatory Visit: Payer: Medicare Other

## 2018-09-30 ENCOUNTER — Encounter: Payer: Self-pay | Admitting: Emergency Medicine

## 2018-09-30 ENCOUNTER — Emergency Department: Payer: Medicare Other

## 2018-09-30 ENCOUNTER — Other Ambulatory Visit: Payer: Self-pay

## 2018-09-30 ENCOUNTER — Emergency Department
Admission: EM | Admit: 2018-09-30 | Discharge: 2018-09-30 | Disposition: A | Discharge status: Home / Self Care | Payer: Medicare Other | Attending: Emergency Medicine | Admitting: Emergency Medicine

## 2018-09-30 DIAGNOSIS — R079 Chest pain, unspecified: Secondary | ICD-10-CM | POA: Diagnosis present

## 2018-09-30 DIAGNOSIS — R0789 Other chest pain: Secondary | ICD-10-CM

## 2018-09-30 DIAGNOSIS — Z9981 Dependence on supplemental oxygen: Secondary | ICD-10-CM | POA: Insufficient documentation

## 2018-09-30 DIAGNOSIS — J449 Chronic obstructive pulmonary disease, unspecified: Secondary | ICD-10-CM | POA: Insufficient documentation

## 2018-09-30 DIAGNOSIS — F17210 Nicotine dependence, cigarettes, uncomplicated: Secondary | ICD-10-CM | POA: Diagnosis not present

## 2018-09-30 LAB — HEPATIC FUNCTION PANEL
ALK PHOS: 74 U/L (ref 38–126)
ALT: 18 U/L (ref 0–44)
AST: 19 U/L (ref 15–41)
Albumin: 4 g/dL (ref 3.5–5.0)
BILIRUBIN TOTAL: 0.5 mg/dL (ref 0.3–1.2)
Total Protein: 7.2 g/dL (ref 6.5–8.1)

## 2018-09-30 LAB — CBC
HEMATOCRIT: 43 % (ref 36.0–46.0)
Hemoglobin: 14.3 g/dL (ref 12.0–15.0)
MCH: 35 pg — AB (ref 26.0–34.0)
MCHC: 33.3 g/dL (ref 30.0–36.0)
MCV: 105.4 fL — AB (ref 80.0–100.0)
Platelets: 339 10*3/uL (ref 150–400)
RBC: 4.08 MIL/uL (ref 3.87–5.11)
RDW: 13.6 % (ref 11.5–15.5)
WBC: 18.4 10*3/uL — ABNORMAL HIGH (ref 4.0–10.5)
nRBC: 0 % (ref 0.0–0.2)

## 2018-09-30 LAB — BASIC METABOLIC PANEL
Anion gap: 11 (ref 5–15)
BUN: 17 mg/dL (ref 6–20)
CHLORIDE: 102 mmol/L (ref 98–111)
CO2: 22 mmol/L (ref 22–32)
CREATININE: 0.51 mg/dL (ref 0.44–1.00)
Calcium: 9.2 mg/dL (ref 8.9–10.3)
GFR calc Af Amer: >60 mL/min (ref >60)
GFR calc non Af Amer: >60 mL/min (ref >60)
GLUCOSE: 141 mg/dL — AB (ref 70–99)
Potassium: 3.9 mmol/L (ref 3.5–5.1)
SODIUM: 135 mmol/L (ref 135–145)

## 2018-09-30 LAB — TROPONIN I: Troponin I: <0.03 ng/mL (ref <0.03)

## 2018-09-30 MED ORDER — OXYCODONE-ACETAMINOPHEN 5-325 MG PO TABS: 1.0000 | ORAL_TABLET | ORAL | 0 refills | Status: DC | PRN

## 2018-09-30 MED ORDER — OXYCODONE-ACETAMINOPHEN 5-325 MG PO TABS
1.0000 | ORAL_TABLET | Freq: Once | ORAL | Status: AC
  Administered 2018-09-30: 1 via ORAL
  Filled 2018-09-30: qty 1

## 2018-09-30 NOTE — ED Triage Notes (Signed)
Pt presents to ED via POV with c/o L sided chest pain that radiates to her back. Pt states pain worse with movement at this time. Pt on chronic 2L via , intermittently up to 3L via .

## 2018-09-30 NOTE — ED Notes (Signed)
C/o chest pain to bilateral ribs with deep breathing X 2 days. Pt started on doxycycline and prednisone by pulmonologist X 2 days ago. Pt wears 2L [place] chronically.

## 2018-09-30 NOTE — Discharge Instructions (Signed)
Return to the emergency room for any new or worrisome symptoms including high fever, increased pain, worsening shortness of breath leg swelling or you feel worse in any way otherwise please follow closely with your primary care doctor first thing on Monday.  We are sending you home with pain medication to ensure that you can continue take deep breaths, if the pain medication stops working the pain gets worse or changes in character or you have any other new or worrisome symptoms please return to the ER

## 2018-09-30 NOTE — ED Provider Notes (Addendum)
[facility] Provider Note  ____________________________________________   I have reviewed the triage vital signs and the nursing notes. Where available I have reviewed prior notes and, if possible and indicated, outside hospital notes.    HISTORY  Chief Complaint Chest Pain    HPI Renee Cochran is a 57 y.o. female resents today complaining of chest wall pain on the right side after a hard coughing fit.  Patient is a history of asthma and COPD on home oxygen.  She routinely coughs.  She was a started on steroids for her cough and states her cough is getting better however she has residual tenderness to her right chest wall.  No pleuritic pain per se unless she breathes very deeply, mostly the pain is when she changes position and pulls up in the bed.  She is on gabapentin for chronic pain as well as extra strength Tylenol but sometimes she has breakthrough pains like this.  She states that this feels exactly like when she bruised a rib from coughing in the past.  She has no exertional symptoms no leg swelling, she states that she is usually on 2 to 3 L of oxygen she has not had to increase that.  She has no productive cough or fever.  Her expressly stated intention is to go home she does not wish to be admitted to the hospital.  The pain is sharp nonradiating on the right chest wall and exactly the spot above her breast where it began hurting when she coughed 1 week ago.  The pain is been constant ever since then.  Is worse when she pulls up changes position or touches that area.  States were not for the pain she would not be here, her breathing she feels is returning to baseline after steroids.  She has also just been started on doxycycline a few days ago for her cough which is improving.  The only thing that is bothering her is her residual pain when she moves or coughs.  She has no personal family history of PE DVT, no leg swelling    Past Medical  History:  Diagnosis Date  . Asthma   . Collapse of left lung   . COPD (chronic obstructive pulmonary disease) [facility](HCC)     Patient Active Problem List   Diagnosis Date Noted  . Intestinal adhesions with partial obstruction (HCC)   . SBO (small bowel obstruction) (HCC) 09/24/2017  . COPD, severe (HCC) 09/24/2017  . COPD with acute exacerbation (HCC) 06/29/2017  . Pneumothorax on left 05/03/2017  . Respiratory failure (HCC) 05/03/2017    Past Surgical History:  Procedure Laterality Date  . CESAREAN SECTION     x 2  . CHEST TUBE INSERTION    . RECTAL SURGERY     for tumor as a child    Prior to Admission medications   Medication Sig Start Date End Date Taking? Authorizing Provider  acetaminophen (TYLENOL) 500 MG tablet Take 1,000 mg by mouth.    [provider]  albuterol (PROVENTIL HFA;VENTOLIN HFA) 108 (90 Base) MCG/ACT inhaler Inhale 2 puffs into the lungs every 4 (four) hours as needed. 04/16/16   [provider]  albuterol (PROVENTIL) (2.5 MG/3ML) 0.083% nebulizer solution Take 2.5 mg by nebulization every 4 (four) hours as needed for wheezing or shortness of breath.    [provider]  fluticasone (FLONASE) 50 MCG/ACT nasal spray Place 1 spray into both nostrils daily. 05/07/17   Katha HammingKonidena, Snehalatha, MD  Fluticasone-Salmeterol (  ADVAIR) 250-50 MCG/DOSE AEPB Inhale 1 puff 2 (two) times daily into the lungs.    [provider]  gabapentin (NEURONTIN) 300 MG capsule Take 300 mg at bedtime by mouth. 07/08/17   [provider]  ipratropium (ATROVENT) 0.02 % nebulizer solution Take 1 mcg by nebulization 3 (three) times daily.  10/14/16   [provider]  montelukast (SINGULAIR) 10 MG tablet Take 1 tablet by mouth at bedtime. 09/27/16   [provider]  predniSONE (STERAPRED UNI-PAK 21 TAB) 10 MG (21) TBPK tablet Taper by 10 mg daily 07/01/17   Milagros Loll, MD  tiotropium (SPIRIVA) 18 MCG inhalation capsule Place 18 mcg into  inhaler and inhale daily.    [provider]    Allergies Basil oil; Garlic; Onion; Penicillins; and Sulfa antibiotics  Family History  Problem Relation Age of Onset  . Colon cancer Father   . Colon cancer Paternal Grandfather   . Diabetes Neg Hx   . Hypertension Neg Hx     Social History Social History   Tobacco Use  . Smoking status: Current Some Day Smoker    Packs/day: 0.50    Types: Cigarettes  . Smokeless tobacco: Never Used  Substance Use Topics  . Alcohol use: No  . Drug use: No    Review of Systems Constitutional: No fever/chills Eyes: No visual changes. ENT: No sore throat. No stiff neck no neck pain Cardiovascular: Denies chest pain. Respiratory: Denies shortness of breath. Gastrointestinal:   no vomiting.  No diarrhea.  No constipation. Genitourinary: Negative for dysuria. Musculoskeletal: Negative lower extremity swelling Skin: Negative for rash. Neurological: Negative for severe headaches, focal weakness or numbness.   ____________________________________________   PHYSICAL EXAM:  VITAL SIGNS: ED Triage Vitals  Enc Vitals Group     BP 09/30/18 1221 133/68     Pulse Rate 09/30/18 1221 91     Resp 09/30/18 1221 20     Temp 09/30/18 1221 98 F (36.7 C)     Temp Source 09/30/18 1221 Oral     SpO2 09/30/18 1221 96 %     Weight 09/30/18 1220 150 lb (68 kg)     Height 09/30/18 1220 5' 0.5" (1.537 m)     Head Circumference --      Peak Flow --      Pain Score 09/30/18 1215 9     Pain Loc --      Pain Edu? --      Excl. in GC? --     Constitutional: Alert and oriented. Well appearing and in no acute distress. Eyes: Conjunctivae are normal Head: Atraumatic HEENT: No congestion/rhinnorhea. Mucous membranes are moist.  Oropharynx non-erythematous Neck:   Nontender with no meningismus, no masses, no stridor Chest: Tender to palpation to the right chest wall, very specific spot above the right breast my touch this area patient states  "ouch that the pain right there" and pulls back.  No crepitus no flail chest no shingles or herpetic lesions noted, no rash no inflammation no erythema, Cardiovascular: Normal rate, regular rhythm. Grossly normal heart sounds.  Good peripheral circulation. Respiratory: Normal respiratory effort.  No retractions. Lungs CTAB. Abdominal: Soft and nontender. No distention. No guarding no rebound Back:  There is no focal tenderness or step off.  there is no midline tenderness there are no lesions noted. there is no CVA tenderness  Musculoskeletal: No lower extremity tenderness, no upper extremity tenderness. No joint effusions, no DVT signs strong distal pulses no edema Neurologic:  Normal speech and language. No gross focal neurologic deficits are appreciated.  Skin:  Skin is warm, dry and intact. No rash noted. Psychiatric: Mood and affect are normal. Speech and behavior are normal.  ____________________________________________   LABS (all labs ordered are listed, but only abnormal results are displayed)  Labs Reviewed  BASIC METABOLIC PANEL - Abnormal; Notable for the following components:      Result Value   Glucose, Bld 141 (*)    All other components within normal limits  CBC - Abnormal; Notable for the following components:   WBC 18.4 (*)    MCV 105.4 (*)    MCH 35.0 (*)    All other components within normal limits  TROPONIN I  HEPATIC FUNCTION PANEL    Pertinent labs  results that were available during my care of the patient were reviewed by me and considered in my medical decision making (see chart for details). ____________________________________________  EKG  I personally interpreted any EKGs ordered by me or triage Sinus rhythm rate 90 bpm no acute ST elevation or depression , normal axis question old.  Inferior infarct. ____________________________________________  RADIOLOGY  Pertinent labs & imaging results that were available during my care of the patient were  reviewed by me and considered in my medical decision making (see chart for details). If possible, patient and/or family made aware of any abnormal findings.  Dg Chest 2 View  Result Date: 09/30/2018 CLINICAL DATA:  Left side chest pain and project if cough for 1 week, history of LEFT lung collapse in 2018, COPD, asthma EXAM: CHEST - 2 VIEW COMPARISON:  03/01/2018 FINDINGS: Upper normal heart size. Mediastinal contours and pulmonary vascularity normal. Bronchitic changes with chronic bibasilar atelectasis versus scarring. Lungs otherwise clear. No acute infiltrate, pleural effusion or pneumothorax. Bones appear demineralized. IMPRESSION: Bronchitic changes with chronic bibasilar atelectasis versus scarring. No acute abnormalities. Electronically Signed   By: Ulyses Southward M.D.   On: 09/30/2018 13:40   ____________________________________________    PROCEDURES  Procedure(s) performed: None  Procedures  Critical Care performed: None  ____________________________________________   INITIAL IMPRESSION / ASSESSMENT AND PLAN / ED COURSE  Pertinent labs & imaging results that were available during my care of the patient were reviewed by me and considered in my medical decision making (see chart for details).  She here with very reproducible right-sided chest wall pain after a "hard cough".  This is very reproducible pain.  When she pulls up in the bed it hurts when she changes position hurts.  It is elicitable by palpation.  Fortunately there is no evidence of a pneumothorax, pneumonia, and I have very low suspicion for ACS or PE, nonetheless, I did do cardiac enzymes which are reassuringly negative despite having had uninterrupted pain for a week.  Her white cell count is elevated however patient was just started on steroids and it is to be expected that there will be some the marginalization and that effect.  Otherwise, very reassuring blood work including negative troponin despite pain for a week.   She and I discussed a CT scan.  She has no history of PE, and I do not think a CT scan likely will advance her care given very clear reproducible chest wall nature of the pain.  Nor is or evidence of shingles, flail chest, chest abscess etc.  Female nurse chaperone present for exam.  My plan at this time is to treat her pain with pain medications and see if she can follow closely up.  Patient is very comfortable with this.  ----------------------------------------- 3:12 PM on 09/30/2018 -----------------------------------------  His pain is much better after Percocet and only hurts now when she changes position the wrong way or touches it, she would like to be discharged we talked about admission but is her strong preference to go home.  At this time, there does not appear to be clinical evidence to support the diagnosis of pulmonary embolus, dissection, myocarditis, endocarditis, pericarditis, pericardial tamponade, acute coronary syndrome, pneumothorax, pneumonia, or any other acute intrathoracic pathology that will require admission or acute intervention. Nor is there evidence of any significant intra-abdominal pathology causing this discomfort.    ____________________________________________   FINAL CLINICAL IMPRESSION(S) / ED DIAGNOSES  Final diagnoses:  None      This chart was dictated using voice recognition software.  Despite best efforts to proofread,  errors can occur which can change meaning.      Jeanmarie Plant, MD 09/30/18 1429    Jeanmarie Plant, MD 09/30/18 [phone]

## 2019-03-02 IMAGING — CR DG CHEST 2V
2 series · 2 of 2 positions shown · non-contrast
Comparison: 03/01/2018

CLINICAL DATA: Left side chest pain and project if cough for 1
week, history of LEFT lung collapse in 1091, COPD, asthma

EXAM:
CHEST - 2 VIEW

[chest pa]
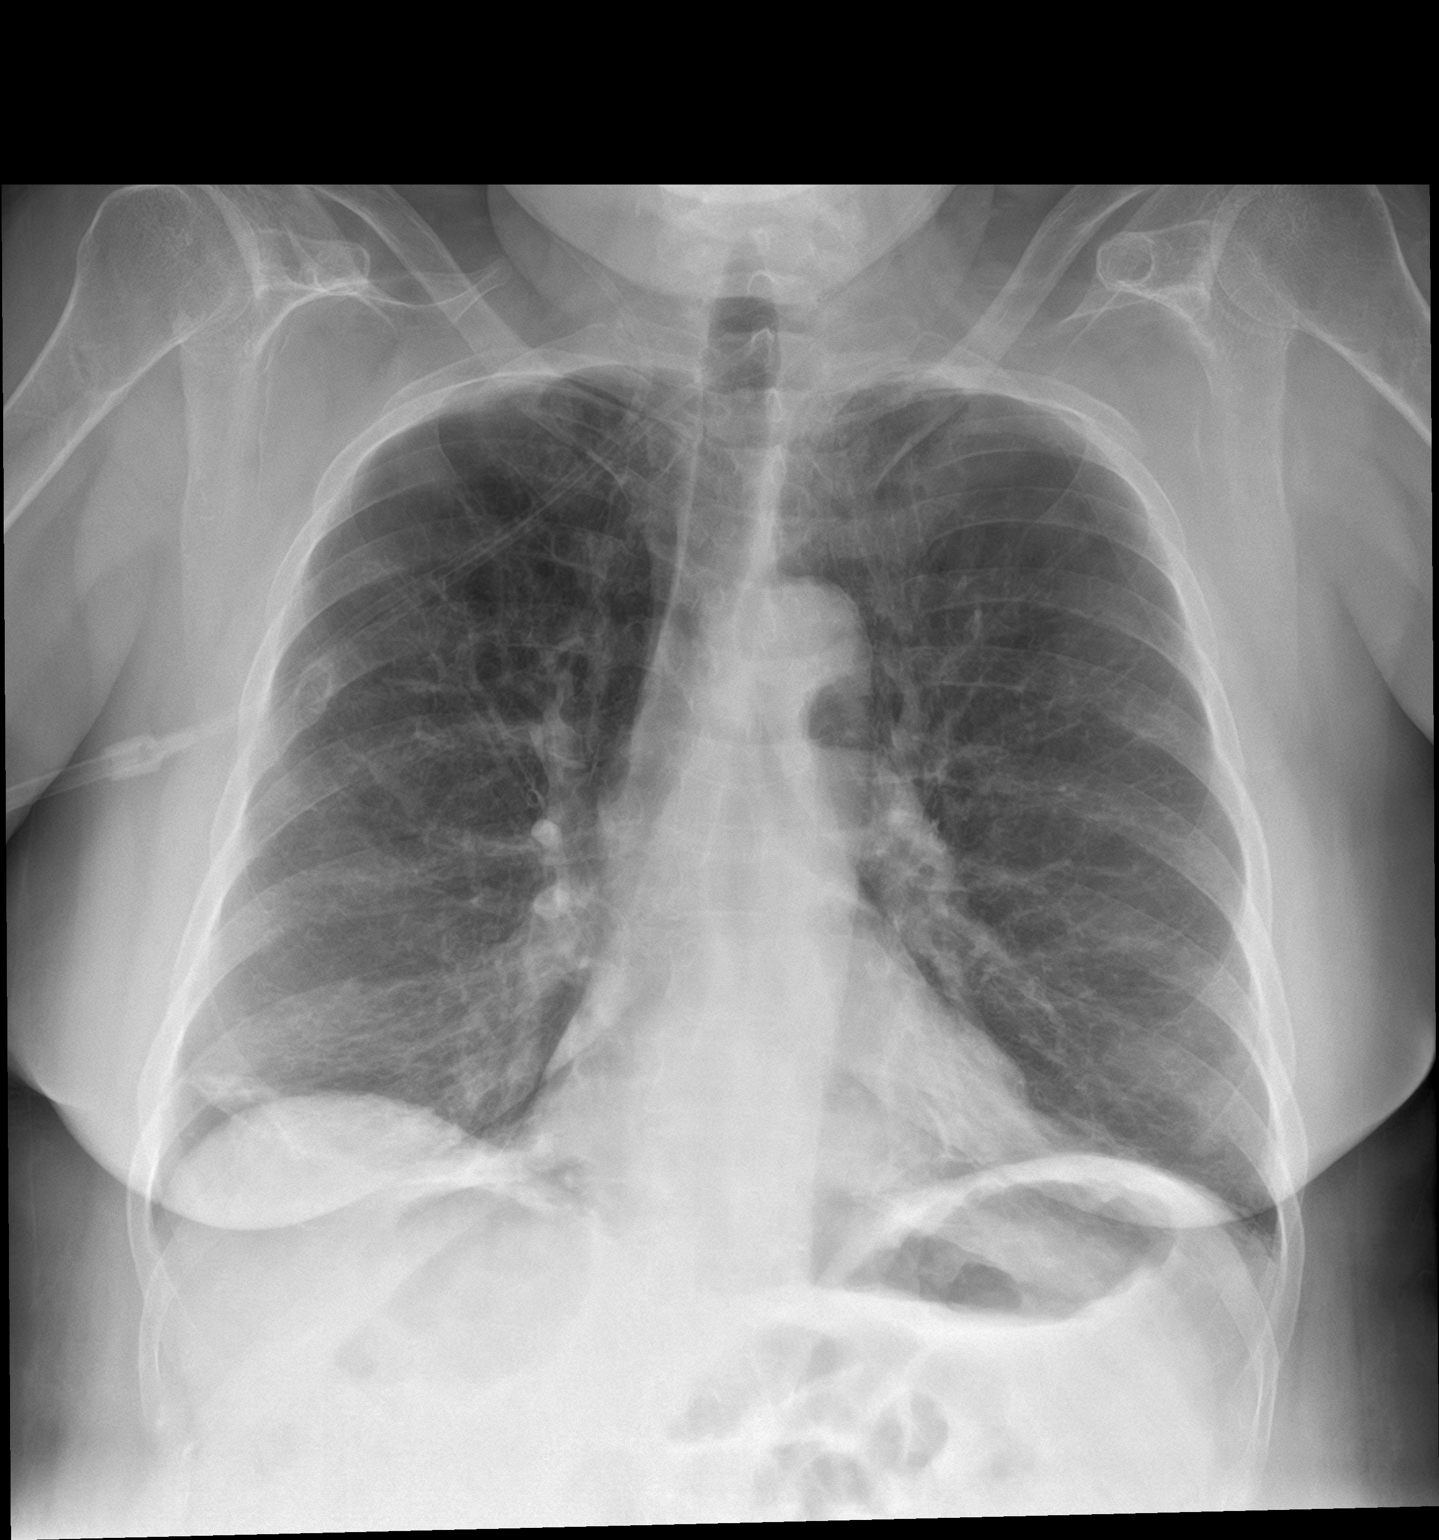

[chest lat]
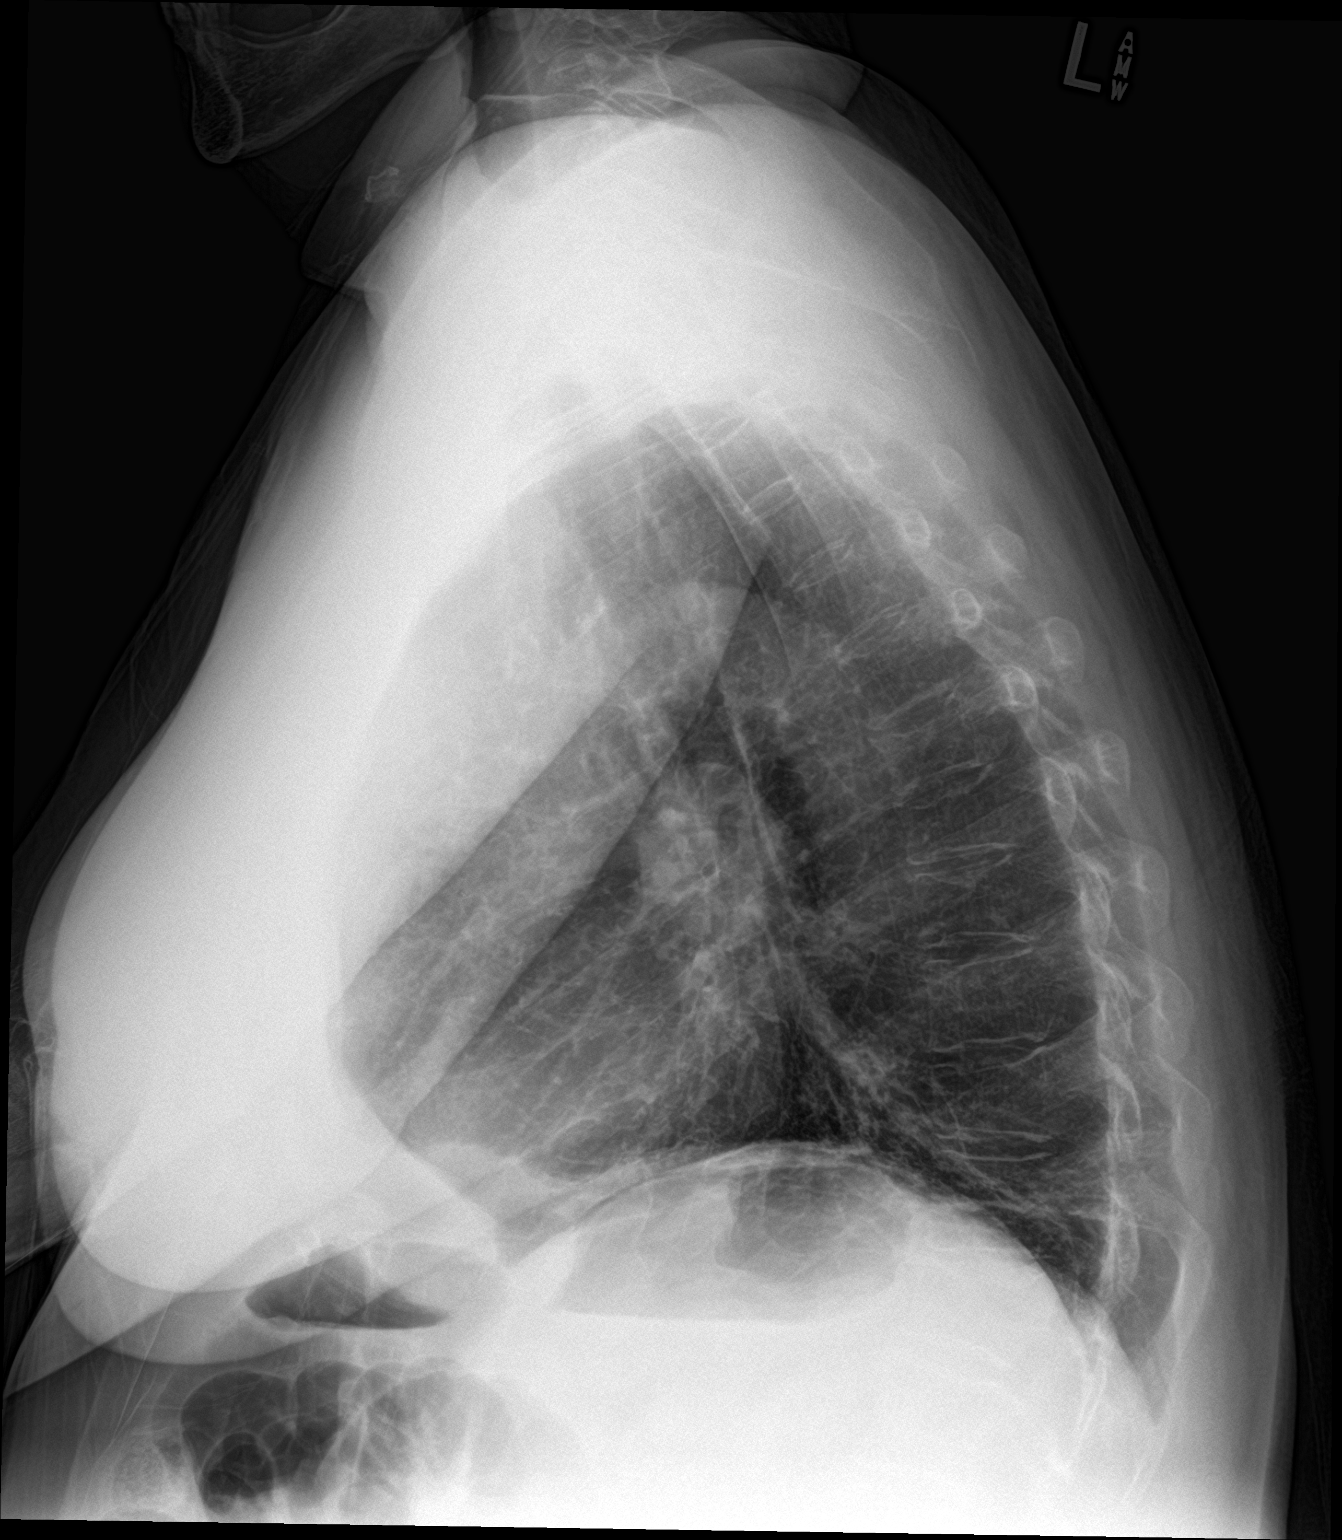

[2 of 2 positions shown; findings below may reference images not displayed]

FINDINGS: Upper normal heart size.

Mediastinal contours and pulmonary vascularity normal.

Bronchitic changes with chronic bibasilar atelectasis versus
scarring.

Lungs otherwise clear.

No acute infiltrate, pleural effusion or pneumothorax.

Bones appear demineralized.
IMPRESSION: Bronchitic changes with chronic bibasilar atelectasis versus
scarring.

No acute abnormalities.

## 2023-05-04 ENCOUNTER — Observation Stay
Admission: EM | Admit: 2023-05-04 | Discharge: 2023-05-06 | Disposition: A | Discharge status: Home / Self Care | Payer: Medicare HMO | Attending: Family Medicine | Admitting: Family Medicine

## 2023-05-04 ENCOUNTER — Other Ambulatory Visit: Payer: Self-pay

## 2023-05-04 DIAGNOSIS — J45909 Unspecified asthma, uncomplicated: Secondary | ICD-10-CM | POA: Insufficient documentation

## 2023-05-04 DIAGNOSIS — J9621 Acute and chronic respiratory failure with hypoxia: Secondary | ICD-10-CM | POA: Diagnosis not present

## 2023-05-04 DIAGNOSIS — Z1152 Encounter for screening for COVID-19: Secondary | ICD-10-CM | POA: Diagnosis not present

## 2023-05-04 DIAGNOSIS — Z79899 Other long term (current) drug therapy: Secondary | ICD-10-CM | POA: Insufficient documentation

## 2023-05-04 DIAGNOSIS — F17210 Nicotine dependence, cigarettes, uncomplicated: Secondary | ICD-10-CM | POA: Diagnosis not present

## 2023-05-04 DIAGNOSIS — K5730 Diverticulosis of large intestine without perforation or abscess without bleeding: Secondary | ICD-10-CM | POA: Insufficient documentation

## 2023-05-04 DIAGNOSIS — A419 Sepsis, unspecified organism: Secondary | ICD-10-CM | POA: Diagnosis present

## 2023-05-04 DIAGNOSIS — I10 Essential (primary) hypertension: Secondary | ICD-10-CM | POA: Diagnosis not present

## 2023-05-04 DIAGNOSIS — J189 Pneumonia, unspecified organism: Secondary | ICD-10-CM | POA: Diagnosis not present

## 2023-05-04 DIAGNOSIS — J441 Chronic obstructive pulmonary disease with (acute) exacerbation: Secondary | ICD-10-CM | POA: Diagnosis not present

## 2023-05-04 DIAGNOSIS — B0229 Other postherpetic nervous system involvement: Secondary | ICD-10-CM | POA: Diagnosis present

## 2023-05-04 DIAGNOSIS — R0781 Pleurodynia: Secondary | ICD-10-CM | POA: Diagnosis present

## 2023-05-04 NOTE — ED Triage Notes (Signed)
Pt presents to ER via ems from home.  Ems initially dispatched for chest pain that started around 0500 today.  Pt reports chest pain was in the mid-sternal, and under her left breast, and radiated down her ;eft arm.  No hx of CAD.  Pt met sepsis criteria with ems, had temp of 101, HR between 120-130, and ETCO2 of 15.  Pt is A&O x4 on arrival, and appears winded, esp on exertion.  Pt has hx of COPD and states she has been told she needs a lung transplant.  Pt on 2L via [place] at home.  Pt also c/o HA that has been getting worse over last 3 days.

## 2023-05-05 ENCOUNTER — Emergency Department: Payer: Medicare HMO

## 2023-05-05 DIAGNOSIS — I10 Essential (primary) hypertension: Secondary | ICD-10-CM

## 2023-05-05 DIAGNOSIS — B0229 Other postherpetic nervous system involvement: Secondary | ICD-10-CM

## 2023-05-05 DIAGNOSIS — J189 Pneumonia, unspecified organism: Secondary | ICD-10-CM

## 2023-05-05 DIAGNOSIS — A419 Sepsis, unspecified organism: Secondary | ICD-10-CM

## 2023-05-05 DIAGNOSIS — J9621 Acute and chronic respiratory failure with hypoxia: Secondary | ICD-10-CM

## 2023-05-05 DIAGNOSIS — J441 Chronic obstructive pulmonary disease with (acute) exacerbation: Secondary | ICD-10-CM

## 2023-05-05 LAB — RESPIRATORY PANEL BY PCR

## 2023-05-05 LAB — CBC WITH DIFFERENTIAL/PLATELET
Abs Immature Granulocytes: 0.06 10*3/uL (ref 0.00–0.07)
Basophils Absolute: 0.1 10*3/uL (ref 0.0–0.1)
Basophils Relative: 0 %
Eosinophils Absolute: 0.1 10*3/uL (ref 0.0–0.5)
Eosinophils Relative: 1 %
HCT: 42.1 % (ref 36.0–46.0)
Hemoglobin: 13.5 g/dL (ref 12.0–15.0)
Immature Granulocytes: 0 %
Lymphocytes Relative: 4 %
Lymphs Abs: 0.5 10*3/uL — ABNORMAL LOW (ref 0.7–4.0)
MCH: 31.4 pg (ref 26.0–34.0)
MCHC: 32.1 g/dL (ref 30.0–36.0)
MCV: 97.9 fL (ref 80.0–100.0)
Monocytes Absolute: 1 10*3/uL (ref 0.1–1.0)
Monocytes Relative: 7 %
Neutro Abs: 12.3 10*3/uL — ABNORMAL HIGH (ref 1.7–7.7)
Neutrophils Relative %: 88 %
Platelets: 238 10*3/uL (ref 150–400)
RBC: 4.3 MIL/uL (ref 3.87–5.11)
RDW: 12.7 % (ref 11.5–15.5)
WBC: 14.1 10*3/uL — ABNORMAL HIGH (ref 4.0–10.5)
nRBC: 0 % (ref 0.0–0.2)

## 2023-05-05 LAB — URINALYSIS, ROUTINE W REFLEX MICROSCOPIC
Bilirubin Urine: NEGATIVE
Glucose, UA: NEGATIVE mg/dL
Hgb urine dipstick: NEGATIVE
Ketones, ur: NEGATIVE mg/dL
Leukocytes,Ua: NEGATIVE
Nitrite: NEGATIVE
Protein, ur: NEGATIVE mg/dL
Specific Gravity, Urine: 1.014 (ref 1.005–1.030)
pH: 7 (ref 5.0–8.0)

## 2023-05-05 LAB — CULTURE, RESPIRATORY W GRAM STAIN: Gram Stain: NONE SEEN

## 2023-05-05 LAB — TROPONIN I (HIGH SENSITIVITY)
Troponin I (High Sensitivity): 8 ng/L
Troponin I (High Sensitivity): 8 ng/L (ref <18)

## 2023-05-05 LAB — BLOOD GAS, VENOUS
Acid-Base Excess: 8.4 mmol/L — ABNORMAL HIGH (ref 0.0–2.0)
Bicarbonate: 34.9 mmol/L — ABNORMAL HIGH (ref 20.0–28.0)
O2 Saturation: 71.1 %
Patient temperature: 37
pCO2, Ven: 55 mmHg (ref 44–60)
pH, Ven: 7.41 (ref 7.25–7.43)
pO2, Ven: 40 mmHg (ref 32–45)

## 2023-05-05 LAB — CULTURE, BLOOD (ROUTINE X 2)
Special Requests: ADEQUATE
Special Requests: ADEQUATE

## 2023-05-05 LAB — COMPREHENSIVE METABOLIC PANEL WITH GFR
ALT: 45 U/L — ABNORMAL HIGH (ref 0–44)
AST: 42 U/L — ABNORMAL HIGH (ref 15–41)
Albumin: 3.9 g/dL (ref 3.5–5.0)
Alkaline Phosphatase: 76 U/L (ref 38–126)
Anion gap: 7 (ref 5–15)
BUN: 15 mg/dL (ref 8–23)
CO2: 30 mmol/L (ref 22–32)
Calcium: 8.8 mg/dL — ABNORMAL LOW (ref 8.9–10.3)
Chloride: 97 mmol/L — ABNORMAL LOW (ref 98–111)
Creatinine, Ser: 0.62 mg/dL (ref 0.44–1.00)
GFR, Estimated: >60 mL/min
Glucose, Bld: 150 mg/dL — ABNORMAL HIGH (ref 70–99)
Potassium: 3.6 mmol/L (ref 3.5–5.1)
Sodium: 134 mmol/L — ABNORMAL LOW (ref 135–145)
Total Bilirubin: 0.8 mg/dL (ref 0.3–1.2)
Total Protein: 6.6 g/dL (ref 6.5–8.1)

## 2023-05-05 LAB — PROTIME-INR
INR: 0.9 (ref 0.8–1.2)
Prothrombin Time: 12.3 s (ref 11.4–15.2)

## 2023-05-05 LAB — LACTIC ACID, PLASMA
Lactic Acid, Venous: 1.4 mmol/L (ref 0.5–1.9)
Lactic Acid, Venous: 1.8 mmol/L (ref 0.5–1.9)

## 2023-05-05 LAB — EXPECTORATED SPUTUM ASSESSMENT W GRAM STAIN, RFLX TO RESP C

## 2023-05-05 LAB — RESP PANEL BY RT-PCR (RSV, FLU A&B, COVID)  RVPGX2
Influenza A by PCR: NEGATIVE
Influenza B by PCR: NEGATIVE
Resp Syncytial Virus by PCR: NEGATIVE
SARS Coronavirus 2 by RT PCR: NEGATIVE

## 2023-05-05 LAB — MRSA NEXT GEN BY PCR, NASAL: MRSA by PCR Next Gen: NOT DETECTED

## 2023-05-05 LAB — STREP PNEUMONIAE URINARY ANTIGEN: Strep Pneumo Urinary Antigen: NEGATIVE

## 2023-05-05 LAB — PROCALCITONIN: Procalcitonin: 0.1 ng/mL

## 2023-05-05 MED ORDER — METHYLPREDNISOLONE SODIUM SUCC 125 MG IJ SOLR
125.0000 mg | Freq: Once | INTRAMUSCULAR | Status: AC
  Administered 2023-05-05: 125 mg via INTRAVENOUS
  Filled 2023-05-05: qty 2

## 2023-05-05 MED ORDER — FLUTICASONE FUROATE-VILANTEROL 200-25 MCG/ACT IN AEPB
1.0000 | INHALATION_SPRAY | Freq: Every day | RESPIRATORY_TRACT | Status: DC
  Administered 2023-05-06: 1 via RESPIRATORY_TRACT
  Filled 2023-05-05: qty 28

## 2023-05-05 MED ORDER — IPRATROPIUM-ALBUTEROL 0.5-2.5 (3) MG/3ML IN SOLN
3.0000 mL | Freq: Once | RESPIRATORY_TRACT | Status: AC
  Administered 2023-05-05: 3 mL via RESPIRATORY_TRACT
  Filled 2023-05-05: qty 3

## 2023-05-05 MED ORDER — LEVOFLOXACIN IN D5W 750 MG/150ML IV SOLN
750.0000 mg | INTRAVENOUS | Status: DC
  Administered 2023-05-05 – 2023-05-06 (×2): 750 mg via INTRAVENOUS
  Filled 2023-05-05 (×2): qty 150

## 2023-05-05 MED ORDER — ONDANSETRON HCL 4 MG PO TABS: 4.0000 mg | ORAL_TABLET | Freq: Four times a day (QID) | ORAL | Status: DC | PRN

## 2023-05-05 MED ORDER — ACETAMINOPHEN 500 MG PO TABS
1000.0000 mg | ORAL_TABLET | Freq: Four times a day (QID) | ORAL | Status: DC | PRN
  Administered 2023-05-05 (×2): 1000 mg via ORAL
  Filled 2023-05-05 (×2): qty 2

## 2023-05-05 MED ORDER — VANCOMYCIN HCL 1500 MG/300ML IV SOLN
1500.0000 mg | Freq: Once | INTRAVENOUS | Status: AC
  Administered 2023-05-05: 1500 mg via INTRAVENOUS
  Filled 2023-05-05: qty 300

## 2023-05-05 MED ORDER — IOHEXOL 300 MG/ML  SOLN
100.0000 mL | Freq: Once | INTRAMUSCULAR | Status: AC | PRN
  Administered 2023-05-05: 100 mL via INTRAVENOUS

## 2023-05-05 MED ORDER — GABAPENTIN 300 MG PO CAPS
300.0000 mg | ORAL_CAPSULE | Freq: Three times a day (TID) | ORAL | Status: DC
  Administered 2023-05-05 – 2023-05-06 (×4): 300 mg via ORAL
  Filled 2023-05-05 (×4): qty 1

## 2023-05-05 MED ORDER — ONDANSETRON HCL 4 MG/2ML IJ SOLN: 4.0000 mg | Freq: Four times a day (QID) | INTRAMUSCULAR | Status: DC | PRN

## 2023-05-05 MED ORDER — PREDNISONE 20 MG PO TABS: 40.0000 mg | ORAL_TABLET | Freq: Every day | ORAL | Status: DC

## 2023-05-05 MED ORDER — LEVALBUTEROL HCL 0.63 MG/3ML IN NEBU: 0.6300 mg | INHALATION_SOLUTION | Freq: Four times a day (QID) | RESPIRATORY_TRACT | Status: DC | PRN

## 2023-05-05 MED ORDER — IPRATROPIUM-ALBUTEROL 0.5-2.5 (3) MG/3ML IN SOLN
3.0000 mL | Freq: Three times a day (TID) | RESPIRATORY_TRACT | Status: DC
  Administered 2023-05-06: 3 mL via RESPIRATORY_TRACT
  Filled 2023-05-05 (×2): qty 3

## 2023-05-05 MED ORDER — SODIUM CHLORIDE 0.9 % IV BOLUS (SEPSIS)
1000.0000 mL | Freq: Once | INTRAVENOUS | Status: AC
  Administered 2023-05-05: 1000 mL via INTRAVENOUS

## 2023-05-05 MED ORDER — METHYLPREDNISOLONE SODIUM SUCC 125 MG IJ SOLR
80.0000 mg | Freq: Two times a day (BID) | INTRAMUSCULAR | Status: AC
  Administered 2023-05-05 – 2023-05-06 (×2): 80 mg via INTRAVENOUS
  Filled 2023-05-05 (×2): qty 2

## 2023-05-05 MED ORDER — MONTELUKAST SODIUM 10 MG PO TABS
10.0000 mg | ORAL_TABLET | Freq: Every day | ORAL | Status: DC
  Administered 2023-05-05: 10 mg via ORAL
  Filled 2023-05-05: qty 1

## 2023-05-05 MED ORDER — LOSARTAN POTASSIUM 25 MG PO TABS
25.0000 mg | ORAL_TABLET | Freq: Every day | ORAL | Status: DC
  Administered 2023-05-05 – 2023-05-06 (×2): 25 mg via ORAL
  Filled 2023-05-05 (×2): qty 1

## 2023-05-05 MED ORDER — SODIUM CHLORIDE 0.9 % IV SOLN
2.0000 g | Freq: Once | INTRAVENOUS | Status: AC
  Administered 2023-05-05: 2 g via INTRAVENOUS
  Filled 2023-05-05: qty 10

## 2023-05-05 MED ORDER — IPRATROPIUM-ALBUTEROL 0.5-2.5 (3) MG/3ML IN SOLN
3.0000 mL | RESPIRATORY_TRACT | Status: DC
  Administered 2023-05-05 (×3): 3 mL via RESPIRATORY_TRACT
  Filled 2023-05-05 (×3): qty 3

## 2023-05-05 MED ORDER — METRONIDAZOLE 500 MG/100ML IV SOLN
500.0000 mg | Freq: Once | INTRAVENOUS | Status: AC
  Administered 2023-05-05: 500 mg via INTRAVENOUS
  Filled 2023-05-05: qty 100

## 2023-05-05 MED ORDER — ENOXAPARIN SODIUM 40 MG/0.4ML IJ SOSY: 40.0000 mg | PREFILLED_SYRINGE | INTRAMUSCULAR | Status: DC

## 2023-05-05 MED ORDER — ACETAMINOPHEN 500 MG PO TABS
1000.0000 mg | ORAL_TABLET | Freq: Once | ORAL | Status: AC
  Administered 2023-05-05: 1000 mg via ORAL
  Filled 2023-05-05: qty 2

## 2023-05-05 MED ORDER — POLYETHYLENE GLYCOL 3350 17 G PO PACK: 17.0000 g | PACK | Freq: Every day | ORAL | Status: DC | PRN

## 2023-05-05 MED ORDER — VANCOMYCIN HCL IN DEXTROSE 1-5 GM/200ML-% IV SOLN: 1000.0000 mg | Freq: Once | INTRAVENOUS | Status: DC

## 2023-05-05 MED ORDER — ACETAMINOPHEN 325 MG PO TABS: 650.0000 mg | ORAL_TABLET | Freq: Four times a day (QID) | ORAL | Status: DC | PRN

## 2023-05-05 NOTE — Progress Notes (Signed)
CODE SEPSIS - PHARMACY COMMUNICATION  **Broad Spectrum Antibiotics should be administered within 1 hour of Sepsis diagnosis**  Time Code Sepsis Called/Page Received: 0114  Antibiotics Ordered: Aztreonam, Flagyl & Vancomycin  Time of 1st antibiotic administration: 0137  Otelia Sergeant, PharmD, MBA 05/05/2023 1:17 AM

## 2023-05-05 NOTE — Assessment & Plan Note (Signed)
Apparently patient was not taking any antihypertensives at home. Blood pressure elevated above 160 systolic. Discussed with patient and started losartan 25 mg daily-PCP can titrate.

## 2023-05-05 NOTE — Hospital Course (Addendum)
Renee Cochran is a 62 y.o. female with medical history significant of emphysema, COPD, chronic respiratory failure on 2 L of oxygen and hypertension came to ER with complaint of worsening cough, shortness of breath and pleuritic chest pain, gradually worsening for the past 3 to 4 days.  06/27: admitted to hospitalist for sepsis d/t pneumonia LUL. Abx w/ levaquin. Of note, she is taking care of her husband who is under hospice care and would like to go home as soon as possible. Agrees to stay at least a night so she can feel little better.  06/28: back on baseline 2L O2 [place].   Consultants:  ***  Procedures: ***      ASSESSMENT & PLAN:   Principal Problem:   Sepsis due to pneumonia (HCC) Active Problems:   COPD with acute exacerbation (HCC)   Acute on chronic hypoxic respiratory failure (HCC)   Primary hypertension   Post herpetic neuralgia   Sepsis due to pneumonia (HCC) vs SIRS d/t COPD Patient met sepsis criteria with fever, tachycardia, and leukocytosis CT with concern of left upper lobe pneumonia. Procalcitonin <0.10, respiratory viral panel negative Levaquin-allergic to cephalosporin and penicillin. strep pneumo and Legionella antigen  COPD with acute exacerbation [facility]) Patient has significant underlying bronchiectasis and emphysema. COPD exacerbation secondary to pneumonia. Solu-Medrol followed by prednisone Bronchodilator Continue supplemental oxygen  Acute on chronic hypoxic respiratory failure (HCC) - resolved Patient was requiring up to 4 L of oxygen with 2 L of baseline use on presentation. Now back at baseline 2L Titrate O2 as needed  Primary hypertension Apparently patient was not taking any antihypertensives at home. Blood pressure elevated above 160 systolic. started losartan 25 mg daily-PCP can titrate.  Post herpetic neuralgia Continue home gabapentin    DVT prophylaxis: *** Pertinent IV fluids/nutrition: *** Central lines / invasive devices:  ***  Code Status: *** ACP documentation reviewed: ***  Current Admission Status: ***  TOC needs / Dispo plan: *** Barriers to discharge / significant pending items: ***

## 2023-05-05 NOTE — ED Provider Notes (Signed)
[facility] Provider Note    Event Date/Time   First MD Initiated Contact with Patient 05/05/23 [phone]     (approximate)   History   Code Sepsis and Chest Pain   HPI  Renee Cochran is a 62 y.o. female with history of chronic COPD on 2 L nasal cannula, shortness of breath, cough, fatigue.  The patient states that for the last several days, she has had progressive worsening cough, sputum production, and fevers with chills.  She has had decreased energy.  She has had nausea.  She says she has had difficulty getting around the house.  She had to increase her oxygen at home.  She felt very weak today and had difficulty even getting around her house.  She normally takes care of her husband who is currently on home hospice.  She subsequent presents for evaluation.  Denies any other complaints.     Physical Exam   Triage Vital Signs: ED Triage Vitals  Enc Vitals Group     BP 05/04/23 2356 (!) 188/75     Pulse Rate 05/04/23 2356 (!) 127     Resp 05/04/23 2356 (!) 26     Temp 05/04/23 2356 99.5 F (37.5 C)     Temp Source 05/04/23 2356 Oral     SpO2 05/04/23 2356 97 %     Weight 05/04/23 2357 145 lb (65.8 kg)     Height 05/04/23 2357 5\' 1"  (1.549 m)     Head Circumference --      Peak Flow --      Pain Score 05/04/23 2357 8     Pain Loc --      Pain Edu? --      Excl. in GC? --     Most recent vital signs: Vitals:   05/05/23 0230 05/05/23 0500  BP: (!) 160/93 (!) 174/88  Pulse: (!) 121 (!) 111  Resp: (!) 22 (!) 23  Temp:    SpO2: 94% 90%     General: Awake, no distress.  CV:  Good peripheral perfusion.  Tachycardic.  Regular. Resp:  Moderate tachypnea with increased work of breathing.  Diminished aeration.  Bilateral wheezing noted. Abd:  No distention.  No overt tenderness. Other:  No significant leg swelling.   ED Results / Procedures / Treatments   Labs (all labs ordered are listed, but only abnormal results are displayed) Labs Reviewed   COMPREHENSIVE METABOLIC PANEL - Abnormal; Notable for the following components:      Result Value   Sodium 134 (*)    Chloride 97 (*)    Glucose, Bld 150 (*)    Calcium 8.8 (*)    AST 42 (*)    ALT 45 (*)    All other components within normal limits  CBC WITH DIFFERENTIAL/PLATELET - Abnormal; Notable for the following components:   WBC 14.1 (*)    Neutro Abs 12.3 (*)    Lymphs Abs 0.5 (*)    All other components within normal limits  URINALYSIS, ROUTINE W REFLEX MICROSCOPIC - Abnormal; Notable for the following components:   Color, Urine YELLOW (*)    APPearance CLEAR (*)    All other components within normal limits  BLOOD GAS, VENOUS - Abnormal; Notable for the following components:   Bicarbonate 34.9 (*)    Acid-Base Excess 8.4 (*)    All other components within normal limits  CULTURE, BLOOD (ROUTINE X 2)  CULTURE, BLOOD (ROUTINE X 2)  RESP PANEL BY RT-PCR (  RSV, FLU A&B, COVID)  RVPGX2  LACTIC ACID, PLASMA  PROTIME-INR  LACTIC ACID, PLASMA  TROPONIN I (HIGH SENSITIVITY)  TROPONIN I (HIGH SENSITIVITY)     EKG Sinus tachycardia, ventricular 123.  PR 150, QRS 91, QTc 445.  No acute ST elevation or depression or acute evidence of acute ischemia or infarct.   RADIOLOGY Chest x-ray: No acute abnormality   I also independently reviewed and agree with radiologist interpretations.   PROCEDURES:  Critical Care performed: Yes, see critical care procedure note(s)  .1-3 Lead EKG Interpretation  Performed by: Shaune Pollack, MD Authorized by: Shaune Pollack, MD     Interpretation: abnormal     ECG rate:  100-130   ECG rate assessment: tachycardic     Rhythm: sinus tachycardia     Ectopy: none     Conduction: normal   Comments:     Indication: Weakness     MEDICATIONS ORDERED IN ED: Medications  ipratropium-albuterol (DUONEB) 0.5-2.5 (3) MG/3ML nebulizer solution 3 mL (3 mLs Nebulization Given 05/05/23 0128)  ipratropium-albuterol (DUONEB) 0.5-2.5 (3) MG/3ML  nebulizer solution 3 mL (3 mLs Nebulization Given 05/05/23 0128)  ipratropium-albuterol (DUONEB) 0.5-2.5 (3) MG/3ML nebulizer solution 3 mL (3 mLs Nebulization Given 05/05/23 0126)  methylPREDNISolone sodium succinate (SOLU-MEDROL) 125 mg/2 mL injection 125 mg (125 mg Intravenous Given 05/05/23 0133)  sodium chloride 0.9 % bolus 1,000 mL (0 mLs Intravenous Stopped 05/05/23 0242)    And  sodium chloride 0.9 % bolus 1,000 mL (0 mLs Intravenous Stopped 05/05/23 0242)  aztreonam (AZACTAM) 2 g in sodium chloride 0.9 % 100 mL IVPB (0 g Intravenous Stopped 05/05/23 0214)  metroNIDAZOLE (FLAGYL) IVPB 500 mg (0 mg Intravenous Stopped 05/05/23 0242)  vancomycin (VANCOREADY) IVPB 1500 mg/300 mL (0 mg Intravenous Stopped 05/05/23 0511)  acetaminophen (TYLENOL) tablet 1,000 mg (1,000 mg Oral Given 05/05/23 0223)  iohexol (OMNIPAQUE) 300 MG/ML solution 100 mL (100 mLs Intravenous Contrast Given 05/05/23 0416)     IMPRESSION / MDM / ASSESSMENT AND PLAN / ED COURSE  I reviewed the triage vital signs and the nursing notes.                              Differential diagnosis includes, but is not limited to, sepsis 2/2 PNA, bronchitis, occult infection, resp failure from COVID-19, CHF, PE, pulm edema  Patient's presentation is most consistent with acute presentation with potential threat to life or bodily function.  The patient is on the cardiac monitor to evaluate for evidence of arrhythmia and/or significant heart rate changes   62 yo F with h/o COPD here with cough, SOB, fevers. Initial vitals concerning for sepsis, likely 2/2 PNA. CXR read as normal however. CT pending. Labs show leukocytosis but normal LA. Renal function is normal. Blood gas without significant retention. UA unremarkable. BCx sent, pt started on broad spectrum empiric abx with 30 cc/kg fluid for sepsis.   CT C/A/P obtained, reviewed, shows LUL PNA. Will admit for sepsis 2/2 PNA.    FINAL CLINICAL IMPRESSION(S) / ED DIAGNOSES   Final  diagnoses:  Sepsis due to pneumonia [facility]     Rx / DC Orders   ED Discharge Orders     None        Note:  This document was prepared using Dragon voice recognition software and may include unintentional dictation errors.   Shaune Pollack, MD 05/05/23 [phone]

## 2023-05-05 NOTE — Assessment & Plan Note (Signed)
Patient has significant underlying bronchiectasis and emphysema. COPD exacerbation secondary to pneumonia. -Solu-Medrol followed by prednisone -Bronchodilator -Continue supplemental oxygen

## 2023-05-05 NOTE — Progress Notes (Signed)
PHARMACY -  BRIEF ANTIBIOTIC NOTE   Pharmacy has received consult(s) for Vancomycin from an ED provider.  The patient's profile has been reviewed for ht/wt/allergies/indication/available labs.    One time order(s) placed for Vancomycin 1500 mg per pt wt: 65.8  Further antibiotics/pharmacy consults should be ordered by admitting physician if indicated.                       Thank you, Otelia Sergeant, PharmD, [facility] 05/05/2023 1:18 AM

## 2023-05-05 NOTE — Assessment & Plan Note (Signed)
Continue home gabapentin ?

## 2023-05-05 NOTE — H&P (Signed)
History and Physical    Patient: Renee Cochran YNW:[id number] DOB: 03-Aug-1961 DOA: 05/04/2023 DOS: the patient was seen and examined on 05/05/2023 PCP: Cain Sieve, MD  Patient coming from: Home  Chief Complaint:  Chief Complaint  Patient presents with   Code Sepsis   Chest Pain   HPI: Renee Cochran is a 62 y.o. female with medical history significant of emphysema, COPD, chronic respiratory failure on 2 L of oxygen and hypertension came to ER with complaint of worsening cough, shortness of breath and pleuritic chest pain, gradually worsening for the past 3 to 4 days.  Patient was having some fever and chills at home.  No sick contacts.  Having increased cough with increased sputum production from her baseline.  Decreased appetite.  Chest pain is mostly pleuritic, nonradiating and not associated with any nausea, vomiting or diaphoresis.  No orthopnea or PND.  No urinary symptoms.  Patient is taking care of her husband who is under hospice care and would like to go home as soon as possible.  Agrees to stay at least a night so she can feel little better.  ED course.  Patient was febrile at 100.8, heart rate 127, blood pressure 160/93, saturating in mid 90s on 4 L of oxygen.  Labs pertinent for neutrophilic predominant leukocytosis at 14.1, mildly elevated AST at 42 and ALT 45. COVID and influenza negative.  Chest x-ray without any significant abnormality. CT chest and abdomen was obtained which shows left upper lobe pneumonia, significant emphysema and bronchiectasis, atherosclerosis, cholelithiasis with no cholecystitis or other acute abdominal abnormality.  EKG-personally reviewed, with sinus tachycardia.  Patient met sepsis criteria and was given IV fluid and broad-spectrum antibiotics in ED.  Review of Systems: As mentioned in the history of present illness. All other systems reviewed and are negative. Past Medical History:  Diagnosis Date   Asthma    Collapse of left  lung    COPD (chronic obstructive pulmonary disease) (HCC)    Past Surgical History:  Procedure Laterality Date   CESAREAN SECTION     x 2   CHEST TUBE INSERTION     RECTAL SURGERY     for tumor as a child   Social History:  reports that she has been smoking cigarettes. She has been smoking an average of .5 packs per day. She has never used smokeless tobacco. She reports that she does not drink alcohol and does not use drugs.  Allergies  Allergen Reactions   Basil Oil Anaphylaxis   Garlic Anaphylaxis and Nausea And Vomiting   Onion Anaphylaxis   Penicillins Anaphylaxis    Has patient had a PCN reaction causing immediate rash, facial/tongue/throat swelling, SOB or lightheadedness with hypotension: yes Has patient had a PCN reaction causing severe rash involving mucus membranes or skin necrosis: yes Has patient had a PCN reaction that required hospitalization yes Has patient had a PCN reaction occurring within the last 10 years: no If all of the above answers are "NO", then may proceed with Cephalosporin use.    Sulfa Antibiotics Anaphylaxis    Family History  Problem Relation Age of Onset   Colon cancer Father    Colon cancer Paternal Grandfather    Diabetes Neg Hx    Hypertension Neg Hx     Prior to Admission medications   Medication Sig Start Date End Date Taking? Authorizing Provider  azithromycin (ZITHROMAX) 250 MG tablet Take 250 mg by mouth daily.   Yes [provider]  acetaminophen (TYLENOL) 500 MG tablet  Take 1,000 mg by mouth.    [provider]  albuterol (PROVENTIL HFA;VENTOLIN HFA) 108 (90 Base) MCG/ACT inhaler Inhale 2 puffs into the lungs every 4 (four) hours as needed. 04/16/16   [provider]  albuterol (PROVENTIL) (2.5 MG/3ML) 0.083% nebulizer solution Take 2.5 mg by nebulization every 4 (four) hours as needed for wheezing or shortness of breath.    [provider]  fluticasone (FLONASE) 50 MCG/ACT nasal spray Place 1  spray into both nostrils daily. 05/07/17   Katha Hamming, MD  Fluticasone-Salmeterol (ADVAIR) 250-50 MCG/DOSE AEPB Inhale 1 puff 2 (two) times daily into the lungs.    [provider]  gabapentin (NEURONTIN) 300 MG capsule Take 300 mg at bedtime by mouth. 07/08/17   [provider]  ipratropium (ATROVENT) 0.02 % nebulizer solution Take 1 mcg by nebulization 3 (three) times daily.  10/14/16   [provider]  montelukast (SINGULAIR) 10 MG tablet Take 1 tablet by mouth at bedtime. 09/27/16   [provider]  oxyCODONE-acetaminophen (PERCOCET) 5-325 MG tablet Take 1 tablet by mouth every 4 (four) hours as needed for severe pain. 09/30/18   Jeanmarie Plant, MD  predniSONE (STERAPRED UNI-PAK 21 TAB) 10 MG (21) TBPK tablet Taper by 10 mg daily 07/01/17   Milagros Loll, MD  tiotropium (SPIRIVA) 18 MCG inhalation capsule Place 18 mcg into inhaler and inhale daily.    [provider]    Physical Exam: Vitals:   05/05/23 1100 05/05/23 1200 05/05/23 1230 05/05/23 1300  BP: (!) 180/94 (!) 164/73 (!) 150/69 (!) 162/74  Pulse: 90 96 (!) 103 97  Resp: (!) 24 (!) 21 19 15   Temp:      TempSrc:      SpO2: 97% 98% 93% 97%  Weight:      Height:        General: Vital signs reviewed.  Frail patient with mildly increased work of breathing. Head: Normocephalic and atraumatic. Eyes: EOMI, conjunctivae normal, no scleral icterus.  Neck: Supple, trachea midline, normal ROM,  Cardiovascular: Sinus tachycardia, no murmurs, gallops, or rubs. Pulmonary/Chest: Decreased air entry bilaterally, no wheeze Abdominal: Soft, non-tender, non-distended, BS +, Extremities: No lower extremity edema bilaterally,  pulses symmetric and intact bilaterally. No cyanosis or clubbing. Neurological: A&O x3, Strength is normal and symmetric bilaterally, cranial nerve II-XII are grossly intact, no focal motor deficit, sensory intact to light touch bilaterally.  Psychiatric: Normal mood  and affect.    Data Reviewed: Prior data reviewed as mentioned above  Assessment and Plan: * Sepsis due to pneumonia [facility]) Patient met sepsis criteria with fever, tachycardia, and leukocytosis CT with concern of left upper lobe pneumonia. Received IV fluid and broad-spectrum antibiotics per sepsis protocol in ED. blood cultures were drawn.  MRSA PCR negative -Admit to medical telemetry -Start her on Levaquin-allergic to cephalosporin and penicillin. -Check procalcitonin, strep pneumo and Legionella antigen -Check respiratory viral panel -Sputum culture -Supportive care   COPD with acute exacerbation [facility]) Patient has significant underlying bronchiectasis and emphysema. COPD exacerbation secondary to pneumonia. -Solu-Medrol followed by prednisone -Bronchodilator -Continue supplemental oxygen  Acute on chronic hypoxic respiratory failure (HCC) Patient was requiring up to 4 L of oxygen with 2 L of baseline use on presentation. -Continue with supplemental oxygen-wean to baseline as tolerated  Primary hypertension Apparently patient was not taking any antihypertensives at home. Blood pressure elevated above 160 systolic. Discussed with patient and started losartan 25 mg daily-PCP can titrate.  Post herpetic neuralgia -Continue home gabapentin  Advance Care Planning:   Code Status: DNR discussed with patient  Consults: None  Family Communication: No family at bedside  Severity of Illness: The appropriate patient status for this patient is INPATIENT. Inpatient status is judged to be reasonable and necessary in order to provide the required intensity of service to ensure the patient's safety. The patient's presenting symptoms, physical exam findings, and initial radiographic and laboratory data in the context of their chronic comorbidities is felt to place them at high Cochran for further clinical deterioration. Furthermore, it is not anticipated that the patient will be medically  stable for discharge from the hospital within 2 midnights of admission.   * I certify that at the point of admission it is my clinical judgment that the patient will require inpatient hospital care spanning beyond 2 midnights from the point of admission due to high intensity of service, high Cochran for further deterioration and high frequency of surveillance required.*  This record has been created using Conservation officer, historic buildings. Errors have been sought and corrected,but may not always be located. Such creation errors do not reflect on the standard of care.   Author: Arnetha Courser, MD 05/05/2023 1:55 PM  For on call review [email or web address].

## 2023-05-05 NOTE — Sepsis Progress Note (Signed)
Sepsis protocol is being followed by [facility]. 

## 2023-05-05 NOTE — Evaluation (Addendum)
Physical Therapy Evaluation Patient Details Name: Renee Cochran MRN: [id number] DOB: 03/22/1961 Today's Date: 05/05/2023  History of Present Illness  Pt is a 62 y.o. female with PMH consisting of chronic COPD on 2 L, emphysema, HTN. Pt admitted to ED (6/27) with complaints of a worsening cough, SOB, and pleuritic chest pain that has gradually been worsening for the past 3-4 days. MD assessment includes: sepsis due to pneumonia, COPD with acute exacerbation.  Clinical Impression  Pt pleasant and motivated for therapy. Pt mod I to ind. with all bed mobility and transfer tasks. She was able to ambulate ~60 feet in the room with no adverse responses. She demonstrated a good, normal cadence and reciprocal gait pattern; no instances of instability or LOB. Min guard was provided for safety but pt likely could have performed with supervision or mod I. Pt has good awareness of when she needs to rest secondary to her chronic COPD and SpO2 desaturation. O2 sats stayed above 90% throughout the entire session on 2L, HR remained high 90's to low 100's. Pt appears to be at functional baseline and reports feeling as such, does not require continued PT services at this time.     Recommendations for follow up therapy are one component of a multi-disciplinary discharge planning process, led by the attending physician.  Recommendations may be updated based on patient status, additional functional criteria and insurance authorization.  Follow Up Recommendations       Assistance Recommended at Discharge None  Patient can return home with the following       Equipment Recommendations None recommended by PT  Recommendations for Other Services       Functional Status Assessment Patient has not had a recent decline in their functional status     Precautions / Restrictions Precautions Precautions: None Restrictions Weight Bearing Restrictions: No      Mobility  Bed Mobility Overal bed mobility:  Independent             General bed mobility comments: No physical assist or cueing needed for supine <> sit.    Transfers Overall transfer level: Modified independent Equipment used: Rolling walker (2 wheels)               General transfer comment: Mod I for STS from elevated bed height with RW. No physical assistance needed.    Ambulation/Gait   Gait Distance (Feet): 60 Feet Assistive device: Rolling walker (2 wheels) Gait Pattern/deviations: WFL(Within Functional Limits)       General Gait Details: Min guard provided but pt able to ambulate Mod I with RW in room. No LOB, good, consistent cadence.  Stairs            Wheelchair Mobility    Modified Rankin (Stroke Patients Only)       Balance Overall balance assessment: Modified Independent                                           Pertinent Vitals/Pain Pain Assessment Pain Assessment: No/denies pain    Home Living Family/patient expects to be discharged to:: Private residence Living Arrangements: Spouse/significant other (Lives with husband who is in hospice care) Available Help at Discharge: Family;Available PRN/intermittently (Daughter available PRN. Hospice aids available a couple times a week.) Type of Home: House Home Access: Ramped entrance       Home Layout: One level Home Equipment: Wheelchair - manual;Shower  seat;Grab bars - toilet;Cane - single point;Rollator (4 wheels);Other (comment) (Pt also has scooter)      Prior Function Prior Level of Function : Independent/Modified Independent             Mobility Comments: Mod I with mobility. Doesn't use any AD in home, uses w/c in community, scooter to get mail. Household ambulator. No falls history. Uses 2 L O2 24/7 at home. ADLs Comments: Independent with all ADL's. Daughter helps with IADL's. Pt independent with transportation.     Hand Dominance        Extremity/Trunk Assessment   Upper Extremity  Assessment Upper Extremity Assessment: Overall WFL for tasks assessed    Lower Extremity Assessment Lower Extremity Assessment: Overall WFL for tasks assessed       Communication   Communication: No difficulties  Cognition Arousal/Alertness: Awake/alert Behavior During Therapy: WFL for tasks assessed/performed Overall Cognitive Status: Within Functional Limits for tasks assessed                                          General Comments      Exercises     Assessment/Plan    PT Assessment Patient does not need any further PT services  PT Problem List         PT Treatment Interventions      PT Goals (Current goals can be found in the Care Plan section)  Acute Rehab PT Goals Patient Stated Goal: Get home PT Goal Formulation: With patient Time For Goal Achievement: 05/18/23 Potential to Achieve Goals: Good    Frequency       Co-evaluation               AM-PAC PT "6 Clicks" Mobility  Outcome Measure Help needed turning from your back to your side while in a flat bed without using bedrails?: None Help needed moving from lying on your back to sitting on the side of a flat bed without using bedrails?: None Help needed moving to and from a bed to a chair (including a wheelchair)?: None Help needed standing up from a chair using your arms (e.g., wheelchair or bedside chair)?: None Help needed to walk in hospital room?: None Help needed climbing 3-5 steps with a railing? : None 6 Click Score: 24    End of Session Equipment Utilized During Treatment: Gait belt;Oxygen Activity Tolerance: Patient tolerated treatment well Patient left: in bed;with call bell/phone within reach Nurse Communication: Mobility status;Other (comment) (Pt requested more iced water)      Time: 1425-1450 PT Time Calculation (min) (ACUTE ONLY): 25 min   Charges:             Timothy Crutchfield, SPT 05/05/23, 4:13 PM  This entire session was performed under  direct supervision and direction of a licensed therapist/therapist assistant. I have personally read, edited and approve of the note as written.  Loran Senters, DPT

## 2023-05-05 NOTE — Consult Note (Addendum)
Pharmacy Antibiotic Note  ASSESSMENT: 62 y.o. female with PMH COPD is presenting with pneumonia. Pharmacy has been consulted to manage levofloxacin dosing. CXR notable for emphysematous changes and scarring. Chest CT notable for left upper lobe pneumonia. Patient with history of penicillin and sulfa allergies. Regarding the penicillin allergy, the reaction was in childhood and patient does not recall many details, aside from throat swelling and difficulty breathing and she required treatment at a hospital. PEN-FAST score of 3 correlates with 20% risk of positive penicillin allergy test. I believe patient would be appropriate for a later-gen cephalosporin (eg, ceftriaxone) due to very low risk of cross-reactivity with cephalosporin, however she takes azithromycin 250 mg daily chronically, so hesitant to include azithromycin as part of an empiric CAP regimen for her. Proceeding with levofloxacin monotherapy for now.  Patient measurements: Height: 5\' 1"  (154.9 cm) Weight: 65.8 kg (145 lb) IBW/kg (Calculated) : 47.8  Vital signs: Temp: 97.8 F (36.6 C) (06/27 0812) Temp Source: Oral (06/27 0812) BP: 162/92 (06/27 0600) Pulse Rate: 106 (06/27 0630) Recent Labs  Lab 05/05/23 0016  WBC 14.1*  CREATININE 0.62   Estimated Creatinine Clearance: 61.7 mL/min (by C-G formula based on SCr of 0.62 mg/dL).  Allergies: Allergies  Allergen Reactions   Basil Oil Anaphylaxis   Garlic Anaphylaxis and Nausea And Vomiting   Onion Anaphylaxis   Penicillins Anaphylaxis    Has patient had a PCN reaction causing immediate rash, facial/tongue/throat swelling, SOB or lightheadedness with hypotension: yes Has patient had a PCN reaction causing severe rash involving mucus membranes or skin necrosis: yes Has patient had a PCN reaction that required hospitalization yes Has patient had a PCN reaction occurring within the last 10 years: no If all of the above answers are "NO", then may proceed with Cephalosporin  use.    Sulfa Antibiotics Anaphylaxis    Antimicrobials this admission: Vancomycin, Aztreonam, Metronidazole 6/27 x 1 Levofloxacin 6/27 >>  Dose adjustments this admission: N/A  Microbiology results: 6/27 BCx: NG<12h 6/27 Sputum cx: sent  6/27 MRSA PCR: sent 6/27 Respiratory panel PCR: Negative  PLAN: Initiate levofloxacin 750 mg IV q24H Follow up culture results to assess for antibiotic optimization. Monitor renal function to assess for any necessary antibiotic dosing changes.   Thank you for allowing pharmacy to be a part of this patient's care.  Will M. Dareen Piano, PharmD PGY-1 Pharmacy Resident 05/05/2023 10:46 AM

## 2023-05-05 NOTE — Assessment & Plan Note (Addendum)
Patient met sepsis criteria with fever, tachycardia, and leukocytosis CT with concern of left upper lobe pneumonia. Received IV fluid and broad-spectrum antibiotics per sepsis protocol in ED. blood cultures were drawn.  MRSA PCR negative -Admit to medical telemetry -Start her on Levaquin-allergic to cephalosporin and penicillin. -Check procalcitonin, strep pneumo and Legionella antigen -Check respiratory viral panel -Sputum culture -Supportive care

## 2023-05-05 NOTE — Assessment & Plan Note (Signed)
Patient was requiring up to 4 L of oxygen with 2 L of baseline use on presentation. -Continue with supplemental oxygen-wean to baseline as tolerated

## 2023-05-05 NOTE — ED Notes (Signed)
Pt up to [facility] with minimal assist. Pt SOB on standing and ambulation. SPO2 WDL. Pt's SOB resided upon getting back in bed.

## 2023-05-06 DIAGNOSIS — J189 Pneumonia, unspecified organism: Secondary | ICD-10-CM | POA: Diagnosis not present

## 2023-05-06 DIAGNOSIS — A419 Sepsis, unspecified organism: Secondary | ICD-10-CM | POA: Diagnosis not present

## 2023-05-06 LAB — BASIC METABOLIC PANEL
Anion gap: 9 (ref 5–15)
BUN: 14 mg/dL (ref 8–23)
CO2: 28 mmol/L (ref 22–32)
Calcium: 9.1 mg/dL (ref 8.9–10.3)
Chloride: 102 mmol/L (ref 98–111)
Creatinine, Ser: 0.54 mg/dL (ref 0.44–1.00)
GFR, Estimated: >60 mL/min (ref >60)
Glucose, Bld: 169 mg/dL — ABNORMAL HIGH (ref 70–99)
Potassium: 3.9 mmol/L (ref 3.5–5.1)
Sodium: 139 mmol/L (ref 135–145)

## 2023-05-06 LAB — CBC
HCT: 39.9 % (ref 36.0–46.0)
Hemoglobin: 13 g/dL (ref 12.0–15.0)
MCH: 31.6 pg (ref 26.0–34.0)
MCHC: 32.6 g/dL (ref 30.0–36.0)
MCV: 96.8 fL (ref 80.0–100.0)
Platelets: 280 10*3/uL (ref 150–400)
RBC: 4.12 MIL/uL (ref 3.87–5.11)
RDW: 12.6 % (ref 11.5–15.5)
WBC: 14.8 10*3/uL — ABNORMAL HIGH (ref 4.0–10.5)
nRBC: 0 % (ref 0.0–0.2)

## 2023-05-06 LAB — GLUCOSE, CAPILLARY: Glucose-Capillary: 139 mg/dL — ABNORMAL HIGH (ref 70–99)

## 2023-05-06 LAB — HIV ANTIBODY (ROUTINE TESTING W REFLEX): HIV Screen 4th Generation wRfx: NONREACTIVE

## 2023-05-06 MED ORDER — ENOXAPARIN SODIUM 40 MG/0.4ML IJ SOSY: 40.0000 mg | PREFILLED_SYRINGE | INTRAMUSCULAR | Status: DC

## 2023-05-06 MED ORDER — LOSARTAN POTASSIUM 25 MG PO TABS: 25.0000 mg | ORAL_TABLET | Freq: Every day | ORAL | 0 refills | Status: AC

## 2023-05-06 MED ORDER — LEVOFLOXACIN 500 MG PO TABS: 500.0000 mg | ORAL_TABLET | Freq: Every day | ORAL | 0 refills | Status: AC

## 2023-05-06 MED ORDER — PREDNISONE 10 MG PO TABS: ORAL_TABLET | ORAL | 0 refills | Status: AC

## 2023-05-06 NOTE — Plan of Care (Addendum)
Patient is alert and oriented x 4. Chronic use of oxygen 2 litters. Reported felling better Vital sign with normal limit. Ready for discharge. PIV Removed. Discharge instructions given to patient indicated understanding.     Problem: Education: Goal: Knowledge of General Education information will improve Description: Including pain rating scale, medication(s)/side effects and non-pharmacologic comfort measures Outcome: Completed/Met   Problem: Health Behavior/Discharge Planning: Goal: Ability to manage health-related needs will improve Outcome: Completed/Met   Problem: Clinical Measurements: Goal: Ability to maintain clinical measurements within normal limits will improve Outcome: Completed/Met Goal: Will remain free from infection Outcome: Completed/Met Goal: Diagnostic test results will improve Outcome: Completed/Met Goal: Respiratory complications will improve Outcome: Completed/Met Goal: Cardiovascular complication will be avoided Outcome: Completed/Met   Problem: Activity: Goal: Risk for activity intolerance will decrease Outcome: Completed/Met   Problem: Nutrition: Goal: Adequate nutrition will be maintained Outcome: Completed/Met   Problem: Coping: Goal: Level of anxiety will decrease Outcome: Completed/Met   Problem: Elimination: Goal: Will not experience complications related to bowel motility Outcome: Completed/Met Goal: Will not experience complications related to urinary retention Outcome: Completed/Met   Problem: Pain Managment: Goal: General experience of comfort will improve Outcome: Completed/Met   Problem: Safety: Goal: Ability to remain free from injury will improve Outcome: Completed/Met   Problem: Skin Integrity: Goal: Risk for impaired skin integrity will decrease Outcome: Completed/Met   Problem: Education: Goal: Knowledge of General Education information will improve Description: Including pain rating scale, medication(s)/side effects and  non-pharmacologic comfort measures Outcome: Completed/Met

## 2023-05-06 NOTE — Care Management Obs Status (Signed)
MEDICARE OBSERVATION STATUS NOTIFICATION   Patient Details  Name: Renee Cochran MRN: [id number] Date of Birth: May 03, 1961   Medicare Observation Status Notification Given:  Yes    Hannahgrace Lalli, LCSW 05/06/2023, 11:51 AM

## 2023-05-06 NOTE — Discharge Summary (Signed)
Physician Discharge Summary   Patient: Linh Deruyter MRN: [id number]  DOB: 08/03/1961   Admit:     Date of Admission: 05/04/2023 Admitted from: home   Discharge: Date of discharge: 05/06/23 Disposition: Home Condition at discharge: good  CODE STATUS: FULL CODE      Discharge Physician: Sunnie Nielsen, DO [facility]     PCP: Cain Sieve, MD  Recommendations for Outpatient Follow-up:  Follow up with PCP Cain Sieve, MD in 1-2 weeks Please obtain labs/tests: consider CBC/BMP in 1-2 weeks if needed Please follow up on the following pending results: Strep pneumo and Legionella antigen PCP AND OTHER OUTPATIENT PROVIDERS: SEE BELOW FOR SPECIFIC DISCHARGE INSTRUCTIONS PRINTED FOR PATIENT IN ADDITION TO GENERIC AVS PATIENT INFO     Discharge Instructions     Diet general   Complete by: As directed    Increase activity slowly   Complete by: As directed          Discharge Diagnoses: Principal Problem:   Sepsis due to pneumonia (HCC) Active Problems:   COPD with acute exacerbation (HCC)   Acute on chronic hypoxic respiratory failure (HCC)   Primary hypertension   Post herpetic neuralgia       Hospital Course: Shakirah Bataille is a 62 y.o. female with medical history significant of emphysema, COPD, chronic respiratory failure on 2 L of oxygen and hypertension came to ER with complaint of worsening cough, shortness of breath and pleuritic chest pain, gradually worsening for the past 3 to 4 days.  06/27: admitted to hospitalist for sepsis d/t pneumonia LUL. Abx w/ levaquin. Of note, she is taking care of her husband who is under hospice care and would like to go home as soon as possible. Agrees to stay at least a night so she can feel little better.  06/28: back on baseline 2L O2 [place].  Patient reports significant improvement and has no concerns for discharge home  Consultants:  None  Procedures: None      ASSESSMENT & PLAN:    Sepsis due to pneumonia (HCC) vs SIRS d/t COPD Patient met sepsis criteria with fever, tachycardia, and leukocytosis CT with concern of left upper lobe pneumonia. Procalcitonin <0.10, respiratory viral panel negative Levaquin-allergic to cephalosporin and penicillin. strep pneumo and Legionella antigen negative procalcitonin low suspicion, however severe COPD will DC on antibiotics  COPD with acute exacerbation (HCC) Patient has significant underlying bronchiectasis and emphysema. COPD exacerbation secondary to pneumonia. Solu-Medrol followed by prednisone Bronchodilator Continue supplemental oxygen Follow-up with PCP/pulmonary outpatient  Acute on chronic hypoxic respiratory failure (HCC) - resolved Patient was requiring up to 4 L of oxygen with 2 L of baseline use on presentation. Now back at baseline 2L Titrate O2 as needed  Primary hypertension Apparently patient was not taking any antihypertensives at home. Blood pressure elevated above 160 systolic. started losartan 25 mg daily-PCP can titrate.  Post herpetic neuralgia Continue home gabapentin             Discharge Instructions  Allergies as of 05/06/2023       Reactions   Basil Oil Anaphylaxis   Garlic Anaphylaxis, Nausea And Vomiting   Onion Anaphylaxis   Penicillins Anaphylaxis   Has patient had a PCN reaction causing immediate rash, facial/tongue/throat swelling, SOB or lightheadedness with hypotension: yes Has patient had a PCN reaction causing severe rash involving mucus membranes or skin necrosis: yes Has patient had a PCN reaction that required hospitalization yes Has patient had a PCN reaction occurring within the  last 10 years: no If all of the above answers are "NO", then may proceed with Cephalosporin use.   Sulfa Antibiotics Anaphylaxis        Medication List     STOP taking these medications    oxyCODONE-acetaminophen 5-325 MG tablet Commonly known as: Percocet   predniSONE 10 MG  (21) Tbpk tablet Commonly known as: STERAPRED UNI-PAK 21 TAB Replaced by: predniSONE 10 MG tablet   tiotropium 18 MCG inhalation capsule Commonly known as: SPIRIVA       TAKE these medications    acetaminophen 500 MG tablet Commonly known as: TYLENOL Take 1,000 mg by mouth.   albuterol (2.5 MG/3ML) 0.083% nebulizer solution Commonly known as: PROVENTIL Take 2.5 mg by nebulization every 4 (four) hours as needed for wheezing or shortness of breath.   albuterol 108 (90 Base) MCG/ACT inhaler Commonly known as: VENTOLIN HFA Inhale 2 puffs into the lungs every 4 (four) hours as needed.   azithromycin 250 MG tablet Commonly known as: ZITHROMAX Take 250 mg by mouth daily.   fluticasone 50 MCG/ACT nasal spray Commonly known as: FLONASE Place 1 spray into both nostrils daily.   Fluticasone-Salmeterol 250-50 MCG/DOSE Aepb Commonly known as: ADVAIR Inhale 1 puff 2 (two) times daily into the lungs.   gabapentin 300 MG capsule Commonly known as: NEURONTIN Take 300 mg at bedtime by mouth.   ipratropium 0.02 % nebulizer solution Commonly known as: ATROVENT Take 1 mcg by nebulization 3 (three) times daily.   levofloxacin 500 MG tablet Commonly known as: Levaquin Take 1 tablet (500 mg total) by mouth daily for 4 days.   losartan 25 MG tablet Commonly known as: COZAAR Take 1 tablet (25 mg total) by mouth daily. Start taking on: May 07, 2023   montelukast 10 MG tablet Commonly known as: SINGULAIR Take 1 tablet by mouth at bedtime.   predniSONE 10 MG tablet Commonly known as: DELTASONE Take 4 tablets (40 mg total) by mouth daily for 2 days, THEN 3 tablets (30 mg total) daily for 2 days, THEN 2 tablets (20 mg total) daily for 2 days, THEN 1 tablet (10 mg total) daily for 2 days. Start taking on: May 06, 2023 Replaces: predniSONE 10 MG (21) Tbpk tablet          Allergies  Allergen Reactions   Basil Oil Anaphylaxis   Garlic Anaphylaxis and Nausea And Vomiting    Onion Anaphylaxis   Penicillins Anaphylaxis    Has patient had a PCN reaction causing immediate rash, facial/tongue/throat swelling, SOB or lightheadedness with hypotension: yes Has patient had a PCN reaction causing severe rash involving mucus membranes or skin necrosis: yes Has patient had a PCN reaction that required hospitalization yes Has patient had a PCN reaction occurring within the last 10 years: no If all of the above answers are "NO", then may proceed with Cephalosporin use.    Sulfa Antibiotics Anaphylaxis     Subjective: Patient reports still having some coughing and SOB on exertion but overall significantly improved compared to yesterday and is asking for discharge   Discharge Exam: BP (!) 155/81 (BP Location: Left Arm)   Pulse 95   Temp 97.6 F (36.4 C)   Resp 18   Ht 5\' 1"  (1.549 m)   Wt 65.8 kg   SpO2 94%   BMI 27.40 kg/m  General: Pt is alert, awake, not in acute distress Cardiovascular: RRR, S1/S2 +, no rubs, no gallops Respiratory:  diminished breath sounds bilaterally, very faint/scattered wheezes Abdominal: Soft,  NT, ND, bowel sounds + Extremities: no edema, no cyanosis     The results of significant diagnostics from this hospitalization (including imaging, microbiology, ancillary and laboratory) are listed below for reference.     Microbiology: Recent Results (from the past 240 hour(s))  Culture, blood (Routine x 2)     Status: None (Preliminary result)   Collection Time: 05/05/23 12:16 AM   Specimen: BLOOD RIGHT ARM  Result Value Ref Range Status   Specimen Description BLOOD RIGHT ARM  Final   Special Requests   Final    BOTTLES DRAWN AEROBIC AND ANAEROBIC Blood Culture adequate volume   Culture   Final    NO GROWTH 1 DAY Performed at [facility], [address] [place], [place] [address]    Report Status PENDING  Incomplete  Culture, blood (Routine x 2)     Status: None (Preliminary result)   Collection Time: 05/05/23 12:16 AM    Specimen: BLOOD LEFT ARM  Result Value Ref Range Status   Specimen Description BLOOD LEFT ARM  Final   Special Requests   Final    BOTTLES DRAWN AEROBIC AND ANAEROBIC Blood Culture adequate volume   Culture   Final    NO GROWTH 1 DAY Performed at [facility], [address] [place], [place] [address]    Report Status PENDING  Incomplete  Resp panel by RT-PCR (RSV, Flu A&B, Covid) Anterior Nasal Swab     Status: None   Collection Time: 05/05/23  8:09 AM   Specimen: Anterior Nasal Swab  Result Value Ref Range Status   SARS Coronavirus 2 by RT PCR NEGATIVE NEGATIVE Final    Comment: (NOTE) SARS-CoV-2 target nucleic acids are NOT DETECTED.  The SARS-CoV-2 RNA is generally detectable in upper respiratory specimens during the acute phase of infection. The lowest concentration of SARS-CoV-2 viral copies this assay can detect is 138 copies/mL. A negative result does not preclude SARS-Cov-2 infection and should not be used as the sole basis for treatment or other patient management decisions. A negative result may occur with  improper specimen collection/handling, submission of specimen other than nasopharyngeal swab, presence of viral mutation(s) within the areas targeted by this assay, and inadequate number of viral copies(<138 copies/mL). A negative result must be combined with clinical observations, patient history, and epidemiological information. The expected result is Negative.  Fact Sheet for Patients:  BloggerCourse.com  Fact Sheet for Healthcare Providers:  SeriousBroker.it  This test is no t yet approved or cleared by the [place] FDA and  has been authorized for detection and/or diagnosis of SARS-CoV-2 by FDA under an Emergency Use Authorization (EUA). This EUA will remain  in effect (meaning this test can be used) for the duration of the COVID-19 declaration under Section 564(b)(1) of the Act,  21 U.S.C.section 360bbb-3(b)(1), unless the authorization is terminated  or revoked sooner.       Influenza A by PCR NEGATIVE NEGATIVE Final   Influenza B by PCR NEGATIVE NEGATIVE Final    Comment: (NOTE) The Xpert Xpress SARS-CoV-2/FLU/RSV plus assay is intended as an aid in the diagnosis of influenza from Nasopharyngeal swab specimens and should not be used as a sole basis for treatment. Nasal washings and aspirates are unacceptable for Xpert Xpress SARS-CoV-2/FLU/RSV testing.  Fact Sheet for Patients: BloggerCourse.com  Fact Sheet for Healthcare Providers: SeriousBroker.it  This test is not yet approved or cleared by the [place] FDA and has been authorized for detection and/or diagnosis of SARS-CoV-2 by FDA  under an Emergency Use Authorization (EUA). This EUA will remain in effect (meaning this test can be used) for the duration of the COVID-19 declaration under Section 564(b)(1) of the Act, 21 U.S.C. section 360bbb-3(b)(1), unless the authorization is terminated or revoked.     Resp Syncytial Virus by PCR NEGATIVE NEGATIVE Final    Comment: (NOTE) Fact Sheet for Patients: BloggerCourse.com  Fact Sheet for Healthcare Providers: SeriousBroker.it  This test is not yet approved or cleared by the [place] FDA and has been authorized for detection and/or diagnosis of SARS-CoV-2 by FDA under an Emergency Use Authorization (EUA). This EUA will remain in effect (meaning this test can be used) for the duration of the COVID-19 declaration under Section 564(b)(1) of the Act, 21 U.S.C. section 360bbb-3(b)(1), unless the authorization is terminated or revoked.  Performed at [facility], [address] [place], [place] [address]   MRSA Next Gen by PCR, Nasal     Status: None   Collection Time: 05/05/23 11:28 AM   Specimen: Nasopharyngeal Swab; Nasal Swab   Result Value Ref Range Status   MRSA by PCR Next Gen NOT DETECTED NOT DETECTED Final    Comment: (NOTE) The GeneXpert MRSA Assay (FDA approved for NASAL specimens only), is one component of a comprehensive MRSA colonization surveillance program. It is not intended to diagnose MRSA infection nor to guide or monitor treatment for MRSA infections. Test performance is not FDA approved in patients less than 60 years old. Performed at [facility], [address] [place], [place] [address]   Respiratory (~20 pathogens) panel by PCR     Status: None   Collection Time: 05/05/23 11:28 AM  Result Value Ref Range Status   Adenovirus NOT DETECTED NOT DETECTED Final   Coronavirus 229E NOT DETECTED NOT DETECTED Final    Comment: (NOTE) The Coronavirus on the Respiratory Panel, DOES NOT test for the novel  Coronavirus (2019 nCoV)    Coronavirus HKU1 NOT DETECTED NOT DETECTED Final   Coronavirus NL63 NOT DETECTED NOT DETECTED Final   Coronavirus OC43 NOT DETECTED NOT DETECTED Final   Metapneumovirus NOT DETECTED NOT DETECTED Final   Rhinovirus / Enterovirus NOT DETECTED NOT DETECTED Final   Influenza A NOT DETECTED NOT DETECTED Final   Influenza B NOT DETECTED NOT DETECTED Final   Parainfluenza Virus 1 NOT DETECTED NOT DETECTED Final   Parainfluenza Virus 2 NOT DETECTED NOT DETECTED Final   Parainfluenza Virus 3 NOT DETECTED NOT DETECTED Final   Parainfluenza Virus 4 NOT DETECTED NOT DETECTED Final   Respiratory Syncytial Virus NOT DETECTED NOT DETECTED Final   Bordetella pertussis NOT DETECTED NOT DETECTED Final   Bordetella Parapertussis NOT DETECTED NOT DETECTED Final   Chlamydophila pneumoniae NOT DETECTED NOT DETECTED Final   Mycoplasma pneumoniae NOT DETECTED NOT DETECTED Final    Comment: Performed at [facility], [address] [place], [place] [address]  Expectorated Sputum Assessment w Gram Stain, Rflx to Resp Cult     Status: None   Collection Time: 05/05/23   2:01 PM   Specimen: Sputum  Result Value Ref Range Status   Specimen Description SPUTUM  Final   Special Requests NONE  Final   Sputum evaluation   Final    THIS SPECIMEN IS ACCEPTABLE FOR SPUTUM CULTURE Performed at [facility], [address] [place], [place] [address]    Report Status 05/05/2023 FINAL  Final  Culture, Respiratory w Gram Stain     Status: None (Preliminary result)  Collection Time: 05/05/23  2:01 PM   Specimen: SPU  Result Value Ref Range Status   Specimen Description   Final    SPUTUM Performed at [facility], [address] [place], [place] [address]    Special Requests   Final    NONE Reflexed from [id number] Performed at [facility], [address] [place], [place] [address]    Gram Stain   Final    NO WBC SEEN RARE Romie Minus NEGATIVE RODS RARE GRAM POSITIVE RODS    Culture   Final    TOO YOUNG TO READ Performed at [facility], [address], [place], [place] [address]    Report Status PENDING  Incomplete     Labs: BNP (last 3 results) No results for input(s): "BNP" in the last 8760 hours. Basic Metabolic Panel: Recent Labs  Lab 05/05/23 0016 05/06/23 0442  NA 134* 139  K 3.6 3.9  CL 97* 102  CO2 30 28  GLUCOSE 150* 169*  BUN 15 14  CREATININE 0.62 0.54  CALCIUM 8.8* 9.1   Liver Function Tests: Recent Labs  Lab 05/05/23 0016  AST 42*  ALT 45*  ALKPHOS 76  BILITOT 0.8  PROT 6.6  ALBUMIN 3.9   No results for input(s): "LIPASE", "AMYLASE" in the last 168 hours. No results for input(s): "AMMONIA" in the last 168 hours. CBC: Recent Labs  Lab 05/05/23 0016 05/06/23 0442  WBC 14.1* 14.8*  NEUTROABS 12.3*  --   HGB 13.5 13.0  HCT 42.1 39.9  MCV 97.9 96.8  PLT 238 280   Cardiac Enzymes: No results for input(s): "CKTOTAL", "CKMB", "CKMBINDEX", "TROPONINI" in the last 168 hours. BNP: Invalid input(s): "POCBNP" CBG: Recent Labs  Lab 05/06/23 0807  GLUCAP 139*   D-Dimer No results for  input(s): "DDIMER" in the last 72 hours. Hgb A1c No results for input(s): "HGBA1C" in the last 72 hours. Lipid Profile No results for input(s): "CHOL", "HDL", "LDLCALC", "TRIG", "CHOLHDL", "LDLDIRECT" in the last 72 hours. Thyroid function studies No results for input(s): "TSH", "T4TOTAL", "T3FREE", "THYROIDAB" in the last 72 hours.  Invalid input(s): "FREET3" Anemia work up No results for input(s): "VITAMINB12", "FOLATE", "FERRITIN", "TIBC", "IRON", "RETICCTPCT" in the last 72 hours. Urinalysis    Component Value Date/Time   COLORURINE YELLOW (A) 05/05/2023 0016   APPEARANCEUR CLEAR (A) 05/05/2023 0016   LABSPEC 1.014 05/05/2023 0016   PHURINE 7.0 05/05/2023 0016   GLUCOSEU NEGATIVE 05/05/2023 0016   HGBUR NEGATIVE 05/05/2023 0016   BILIRUBINUR NEGATIVE 05/05/2023 0016   KETONESUR NEGATIVE 05/05/2023 0016   PROTEINUR NEGATIVE 05/05/2023 0016   NITRITE NEGATIVE 05/05/2023 0016   LEUKOCYTESUR NEGATIVE 05/05/2023 0016   Sepsis Labs Recent Labs  Lab 05/05/23 0016 05/06/23 0442  WBC 14.1* 14.8*   Microbiology Recent Results (from the past 240 hour(s))  Culture, blood (Routine x 2)     Status: None (Preliminary result)   Collection Time: 05/05/23 12:16 AM   Specimen: BLOOD RIGHT ARM  Result Value Ref Range Status   Specimen Description BLOOD RIGHT ARM  Final   Special Requests   Final    BOTTLES DRAWN AEROBIC AND ANAEROBIC Blood Culture adequate volume   Culture   Final    NO GROWTH 1 DAY Performed at [facility], [address] [place], [place] [address]    Report Status PENDING  Incomplete  Culture, blood (Routine x 2)     Status: None (Preliminary result)   Collection Time: 05/05/23 12:16 AM  Specimen: BLOOD LEFT ARM  Result Value Ref Range Status   Specimen Description BLOOD LEFT ARM  Final   Special Requests   Final    BOTTLES DRAWN AEROBIC AND ANAEROBIC Blood Culture adequate volume   Culture   Final    NO GROWTH 1 DAY Performed at [facility], [address], [place], [place] [address]    Report Status PENDING  Incomplete  Resp panel by RT-PCR (RSV, Flu A&B, Covid) Anterior Nasal Swab     Status: None   Collection Time: 05/05/23  8:09 AM   Specimen: Anterior Nasal Swab  Result Value Ref Range Status   SARS Coronavirus 2 by RT PCR NEGATIVE NEGATIVE Final    Comment: (NOTE) SARS-CoV-2 target nucleic acids are NOT DETECTED.  The SARS-CoV-2 RNA is generally detectable in upper respiratory specimens during the acute phase of infection. The lowest concentration of SARS-CoV-2 viral copies this assay can detect is 138 copies/mL. A negative result does not preclude SARS-Cov-2 infection and should not be used as the sole basis for treatment or other patient management decisions. A negative result may occur with  improper specimen collection/handling, submission of specimen other than nasopharyngeal swab, presence of viral mutation(s) within the areas targeted by this assay, and inadequate number of viral copies(<138 copies/mL). A negative result must be combined with clinical observations, patient history, and epidemiological information. The expected result is Negative.  Fact Sheet for Patients:  BloggerCourse.com  Fact Sheet for Healthcare Providers:  SeriousBroker.it  This test is no t yet approved or cleared by the [place] FDA and  has been authorized for detection and/or diagnosis of SARS-CoV-2 by FDA under an Emergency Use Authorization (EUA). This EUA will remain  in effect (meaning this test can be used) for the duration of the COVID-19 declaration under Section 564(b)(1) of the Act, 21 U.S.C.section 360bbb-3(b)(1), unless the authorization is terminated  or revoked sooner.       Influenza A by PCR NEGATIVE NEGATIVE Final   Influenza B by PCR NEGATIVE NEGATIVE Final    Comment: (NOTE) The Xpert Xpress SARS-CoV-2/FLU/RSV plus assay is intended as  an aid in the diagnosis of influenza from Nasopharyngeal swab specimens and should not be used as a sole basis for treatment. Nasal washings and aspirates are unacceptable for Xpert Xpress SARS-CoV-2/FLU/RSV testing.  Fact Sheet for Patients: BloggerCourse.com  Fact Sheet for Healthcare Providers: SeriousBroker.it  This test is not yet approved or cleared by the [place] FDA and has been authorized for detection and/or diagnosis of SARS-CoV-2 by FDA under an Emergency Use Authorization (EUA). This EUA will remain in effect (meaning this test can be used) for the duration of the COVID-19 declaration under Section 564(b)(1) of the Act, 21 U.S.C. section 360bbb-3(b)(1), unless the authorization is terminated or revoked.     Resp Syncytial Virus by PCR NEGATIVE NEGATIVE Final    Comment: (NOTE) Fact Sheet for Patients: BloggerCourse.com  Fact Sheet for Healthcare Providers: SeriousBroker.it  This test is not yet approved or cleared by the [place] FDA and has been authorized for detection and/or diagnosis of SARS-CoV-2 by FDA under an Emergency Use Authorization (EUA). This EUA will remain in effect (meaning this test can be used) for the duration of the COVID-19 declaration under Section 564(b)(1) of the Act, 21 U.S.C. section 360bbb-3(b)(1), unless the authorization is terminated or revoked.  Performed at [facility], [address], [place], [place] [address]   MRSA Next Gen by PCR, Nasal  Status: None   Collection Time: 05/05/23 11:28 AM   Specimen: Nasopharyngeal Swab; Nasal Swab  Result Value Ref Range Status   MRSA by PCR Next Gen NOT DETECTED NOT DETECTED Final    Comment: (NOTE) The GeneXpert MRSA Assay (FDA approved for NASAL specimens only), is one component of a comprehensive MRSA colonization surveillance program. It is not intended to  diagnose MRSA infection nor to guide or monitor treatment for MRSA infections. Test performance is not FDA approved in patients less than 40 years old. Performed at [facility], [address] [place], [place] [address]   Respiratory (~20 pathogens) panel by PCR     Status: None   Collection Time: 05/05/23 11:28 AM  Result Value Ref Range Status   Adenovirus NOT DETECTED NOT DETECTED Final   Coronavirus 229E NOT DETECTED NOT DETECTED Final    Comment: (NOTE) The Coronavirus on the Respiratory Panel, DOES NOT test for the novel  Coronavirus (2019 nCoV)    Coronavirus HKU1 NOT DETECTED NOT DETECTED Final   Coronavirus NL63 NOT DETECTED NOT DETECTED Final   Coronavirus OC43 NOT DETECTED NOT DETECTED Final   Metapneumovirus NOT DETECTED NOT DETECTED Final   Rhinovirus / Enterovirus NOT DETECTED NOT DETECTED Final   Influenza A NOT DETECTED NOT DETECTED Final   Influenza B NOT DETECTED NOT DETECTED Final   Parainfluenza Virus 1 NOT DETECTED NOT DETECTED Final   Parainfluenza Virus 2 NOT DETECTED NOT DETECTED Final   Parainfluenza Virus 3 NOT DETECTED NOT DETECTED Final   Parainfluenza Virus 4 NOT DETECTED NOT DETECTED Final   Respiratory Syncytial Virus NOT DETECTED NOT DETECTED Final   Bordetella pertussis NOT DETECTED NOT DETECTED Final   Bordetella Parapertussis NOT DETECTED NOT DETECTED Final   Chlamydophila pneumoniae NOT DETECTED NOT DETECTED Final   Mycoplasma pneumoniae NOT DETECTED NOT DETECTED Final    Comment: Performed at [facility], [address] [place], [place] [address]  Expectorated Sputum Assessment w Gram Stain, Rflx to Resp Cult     Status: None   Collection Time: 05/05/23  2:01 PM   Specimen: Sputum  Result Value Ref Range Status   Specimen Description SPUTUM  Final   Special Requests NONE  Final   Sputum evaluation   Final    THIS SPECIMEN IS ACCEPTABLE FOR SPUTUM CULTURE Performed at [facility], [address]  [place], [place] [address]    Report Status 05/05/2023 FINAL  Final  Culture, Respiratory w Gram Stain     Status: None (Preliminary result)   Collection Time: 05/05/23  2:01 PM   Specimen: SPU  Result Value Ref Range Status   Specimen Description   Final    SPUTUM Performed at [facility], [address] [place], [place] [address]    Special Requests   Final    NONE Reflexed from [id number] Performed at [place] [facility], [address], [place], [place] [address]    Gram Stain   Final    NO WBC SEEN RARE Romie Minus NEGATIVE RODS RARE GRAM POSITIVE RODS    Culture   Final    TOO YOUNG TO READ Performed at [facility], [address], [place], [place] [address]    Report Status PENDING  Incomplete   Imaging CT CHEST ABDOMEN PELVIS W CONTRAST  Result Date: 05/05/2023 CLINICAL DATA:  62 year old female history of chest pain. Possible sepsis. EXAM: CT CHEST, ABDOMEN, AND PELVIS WITH CONTRAST TECHNIQUE: Multidetector CT imaging of the chest,  abdomen and pelvis was performed following the standard protocol during bolus administration of intravenous contrast. RADIATION DOSE REDUCTION: This exam was performed according to the departmental dose-optimization program which includes automated exposure control, adjustment of the mA and/or kV according to patient size and/or use of iterative reconstruction technique. CONTRAST:  OMNIPAQUE IOHEXOL 300 MG/ML  SOLN COMPARISON:  CT of the abdomen and pelvis 09/23/2017. No prior chest CT. FINDINGS: CT CHEST FINDINGS Cardiovascular: Heart size is normal. There is no significant pericardial fluid, thickening or pericardial calcification. There is aortic atherosclerosis, as well as atherosclerosis of the great vessels of the mediastinum and the coronary arteries, including calcified atherosclerotic plaque in the left anterior descending and right coronary arteries. Mediastinum/Nodes: No pathologically enlarged mediastinal or hilar lymph nodes.  Esophagus is unremarkable in appearance. No axillary lymphadenopathy. Lungs/Pleura: Patchy airspace consolidation noted in the medial aspect of the left upper lobe. Widespread areas of cylindrical bronchiectasis with thickening of the peribronchovascular interstitium and regional areas of architectural distortion, often associated with areas of mucoid impaction, most evident in the lower lungs bilaterally. Moderate centrilobular and paraseptal emphysema. No pleural effusions. No definite suspicious appearing pulmonary nodules or masses are noted. Musculoskeletal: Multiple chronic appearing compression fractures are noted in the thoracic spine most evident at T6, T7, T8, T10 and T12, most severe at T7 where there is up to 50% loss of central vertebral body height. There are no aggressive appearing lytic or blastic lesions noted in the visualized portions of the skeleton. CT ABDOMEN PELVIS FINDINGS Hepatobiliary: No suspicious cystic or solid hepatic lesions. No intra or extrahepatic biliary ductal dilatation. Tiny calcified gallstones measuring 2-3 mm lying dependently in the gallbladder. Gallbladder is moderately distended. Gallbladder wall thickness is normal. No pericholecystic fluid or surrounding inflammatory changes. Pancreas: No pancreatic mass. No pancreatic ductal dilatation. No pancreatic or peripancreatic fluid collections or inflammatory changes. Spleen: Unremarkable. Adrenals/Urinary Tract: Bilateral kidneys and bilateral adrenal glands are normal in appearance. No hydroureteronephrosis. Urinary bladder is normal in appearance. Stomach/Bowel: The appearance of the stomach is normal. No pathologic dilatation of small bowel or colon. A few scattered colonic diverticuli are noted, without surrounding inflammatory changes to indicate an acute diverticulitis at this time. Normal appendix. Vascular/Lymphatic: Aortic atherosclerosis, without evidence of aneurysm or dissection in the abdominal or pelvic  vasculature. No lymphadenopathy noted in the abdomen or pelvis. Reproductive: Uterus and ovaries are unremarkable in appearance. Other: No significant volume of ascites.  No pneumoperitoneum. Musculoskeletal: There are no aggressive appearing lytic or blastic lesions noted in the visualized portions of the skeleton. IMPRESSION: 1. Left upper lobe pneumonia. 2. No acute findings noted in the abdomen or pelvis to account for the patient's symptoms. 3. Moderate centrilobular and paraseptal emphysema; imaging findings suggestive of underlying COPD. There is also widespread bronchiectasis and chronic appearing postinfectious or inflammatory scarring throughout the lung bases bilaterally. 4. Aortic atherosclerosis, in addition to two-vessel coronary artery disease. Please note that although the presence of coronary artery calcium documents the presence of coronary artery disease, the severity of this disease and any potential stenosis cannot be assessed on this non-gated CT examination. Assessment for potential risk factor modification, dietary therapy or pharmacologic therapy may be warranted, if clinically indicated. 5. Cholelithiasis without evidence of acute cholecystitis. 6. Colonic diverticulosis without evidence of acute diverticulitis at this time. 7. Additional incidental findings, as above. Electronically Signed   By: Trudie Reed M.D.   On: 05/05/2023 05:23   DG Chest Portable 1 View  Result Date: 05/05/2023 CLINICAL  DATA:  Shortness of breath.  Code sepsis with chest pain. EXAM: PORTABLE CHEST 1 VIEW COMPARISON:  PA and lateral chest 09/30/2018 FINDINGS: Limited exam due to overlying clothing and wires. The lungs emphysematous with scattered linear scarring or atelectasis in the bases. No focal pneumonia is grossly seen.  No pleural effusion. The cardiomediastinal silhouette and vascular pattern are within normal limits. There is mild calcification in the aorta. Osteopenia and thoracic spondylosis.  IMPRESSION: Limited exam due to overlying clothing and wires. No evidence of acute chest disease, as visualized. COPD. Electronically Signed   By: Almira Bar M.D.   On: 05/05/2023 00:54      Time coordinating discharge: over 30 minutes  SIGNED:  Sunnie Nielsen DO [facility]

## 2023-05-06 NOTE — Progress Notes (Signed)
Pharmacy Antibiotic Note  Renee Cochran is a 62 y.o. female admitted on 05/04/2023 with pneumonia.  Pharmacy has been consulted for levofloxacin dosing.  Allergy to cephalosporins. Takes azithromycin consistently at home. Day 2 of levofloxacin.   Plan: Continue levofloxacin IV 750 mg every 24 hours  Height: 5\' 1"  (154.9 cm) Weight: 65.8 kg (145 lb) IBW/kg (Calculated) : 47.8  Temp (24hrs), Avg:97.8 F (36.6 C), Min:97.6 F (36.4 C), Max:98.1 F (36.7 C)  Recent Labs  Lab 05/05/23 0016 05/05/23 2121 05/06/23 0442  WBC 14.1*  --  14.8*  CREATININE 0.62  --  0.54  LATICACIDVEN 1.4 1.8  --     Estimated Creatinine Clearance: 61.7 mL/min (by C-G formula based on SCr of 0.54 mg/dL).    Allergies  Allergen Reactions   Basil Oil Anaphylaxis   Garlic Anaphylaxis and Nausea And Vomiting   Onion Anaphylaxis   Penicillins Anaphylaxis    Has patient had a PCN reaction causing immediate rash, facial/tongue/throat swelling, SOB or lightheadedness with hypotension: yes Has patient had a PCN reaction causing severe rash involving mucus membranes or skin necrosis: yes Has patient had a PCN reaction that required hospitalization yes Has patient had a PCN reaction occurring within the last 10 years: no If all of the above answers are "NO", then may proceed with Cephalosporin use.    Sulfa Antibiotics Anaphylaxis    Antimicrobials this admission: Vancomycin, Aztreonam, Metronidazole 6/27 x 1 Levofloxacin 6/27 >>  Dose adjustments this admission: N/a  Microbiology results: 6/27 BCx: NG<12h 6/27 Sputum cx: rare GNR, rare GPR 6/27 MRSA PCR: not detected 6/27 Respiratory panel PCR: Negative  Thank you for allowing pharmacy to be a part of this patient's care.  Elliot Gurney, PharmD, BCPS Clinical Pharmacist  05/06/2023 10:24 AM

## 2023-05-06 NOTE — Progress Notes (Signed)
OT Screen Note  Patient Details Name: Stephanny Hoctor MRN: [id number] DOB: July 23, 1961   Cancelled Treatment:    Reason Eval/Treat Not Completed: OT screened, no needs identified, will sign off. Consult received, chart reviewed. Pt at baseline independence. No skilled OT needs at this time. Will sign off.   Arman Filter., MPH, MS, OTR/L ascom [phone] 05/06/23, 12:06 PM

## 2023-05-06 NOTE — Care Management CC44 (Signed)
Condition Code 44 Documentation Completed  Patient Details  Name: Renee Cochran MRN: [id number] Date of Birth: 06-23-1961   Condition Code 44 given:  Yes Patient signature on Condition Code 44 notice:  Yes Documentation of 2 MD's agreement:  Yes Code 44 added to claim:  Yes    Allena Katz, LCSW 05/06/2023, 11:51 AM

## 2023-05-06 NOTE — TOC CM/SW Note (Signed)
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 Prior/Current Home Services: No current home services Social Determinants of Health Reivew: SDOH reviewed no interventions necessary Readmission risk has been reviewed: Yes Transition of care needs: no transition of care needs at this time

## 2023-05-06 NOTE — Progress Notes (Signed)
Nutrition Brief Note  RD consulted for assessment of nutritional requirements/ status.   Wt Readings from Last 15 Encounters:  05/04/23 65.8 kg  09/30/18 68 kg  09/24/17 68.4 kg  06/29/17 68.7 kg  05/06/17 68.7 kg  10/25/16 63.5 kg   Pt with medical history significant of emphysema, COPD, chronic respiratory failure on 2 L of oxygen and hypertension admitted with complaint of worsening cough, shortness of breath and pleuritic chest pain, gradually worsening for the past 3 to 4 days PTA.   Pt admitted with sepsis secondary to pneumonia and COPD exacerbation.   Medications reviewed and include prednisone.   Plan to d/c home today per MD orders.   Labs reviewed: CBGS: 139.    Current diet order is Heart Healthy (liberalized to regular), patient is consuming approximately 100% of meals at this time. Labs and medications reviewed.   No nutrition interventions warranted at this time. If nutrition issues arise, please consult RD.   Levada Schilling, RD, LDN, CDCES Registered Dietitian II Certified Diabetes Care and Education Specialist Please refer to [facility] for RD and/or RD on-call/weekend/after hours pager

## 2023-05-07 LAB — CULTURE, BLOOD (ROUTINE X 2): Culture: NO GROWTH

## 2023-05-09 LAB — CULTURE, RESPIRATORY W GRAM STAIN

## 2023-05-09 LAB — CULTURE, BLOOD (ROUTINE X 2): Culture: NO GROWTH

## 2023-05-09 LAB — LEGIONELLA PNEUMOPHILA SEROGP 1 UR AG: L. pneumophila Serogp 1 Ur Ag: NEGATIVE

## 2023-05-10 LAB — CULTURE, BLOOD (ROUTINE X 2)

## 2024-02-02 ENCOUNTER — Emergency Department
Admission: EM | Admit: 2024-02-02 | Discharge: 2024-02-03 | Disposition: A | Discharge status: Home / Self Care | Attending: Emergency Medicine | Admitting: Emergency Medicine

## 2024-02-02 ENCOUNTER — Emergency Department

## 2024-02-02 DIAGNOSIS — J441 Chronic obstructive pulmonary disease with (acute) exacerbation: Secondary | ICD-10-CM | POA: Insufficient documentation

## 2024-02-02 DIAGNOSIS — I10 Essential (primary) hypertension: Secondary | ICD-10-CM | POA: Diagnosis not present

## 2024-02-02 DIAGNOSIS — R0602 Shortness of breath: Secondary | ICD-10-CM | POA: Diagnosis present

## 2024-02-02 DIAGNOSIS — D72829 Elevated white blood cell count, unspecified: Secondary | ICD-10-CM | POA: Diagnosis not present

## 2024-02-02 LAB — CBC WITH DIFFERENTIAL/PLATELET
Abs Immature Granulocytes: 0.12 10*3/uL — ABNORMAL HIGH (ref 0.00–0.07)
Basophils Absolute: 0 10*3/uL (ref 0.0–0.1)
Basophils Relative: 0 %
Eosinophils Absolute: 0.1 10*3/uL (ref 0.0–0.5)
Eosinophils Relative: 1 %
HCT: 36.8 % (ref 36.0–46.0)
Hemoglobin: 12.2 g/dL (ref 12.0–15.0)
Immature Granulocytes: 1 %
Lymphocytes Relative: 7 %
Lymphs Abs: 1 10*3/uL (ref 0.7–4.0)
MCH: 31.1 pg (ref 26.0–34.0)
MCHC: 33.2 g/dL (ref 30.0–36.0)
MCV: 93.9 fL (ref 80.0–100.0)
Monocytes Absolute: 1.4 10*3/uL — ABNORMAL HIGH (ref 0.1–1.0)
Monocytes Relative: 9 %
Neutro Abs: 12.9 10*3/uL — ABNORMAL HIGH (ref 1.7–7.7)
Neutrophils Relative %: 82 %
Platelets: 235 10*3/uL (ref 150–400)
RBC: 3.92 MIL/uL (ref 3.87–5.11)
RDW: 12.6 % (ref 11.5–15.5)
WBC: 15.6 10*3/uL — ABNORMAL HIGH (ref 4.0–10.5)
nRBC: 0 % (ref 0.0–0.2)

## 2024-02-02 LAB — COMPREHENSIVE METABOLIC PANEL WITH GFR
ALT: 31 U/L (ref 0–44)
AST: 25 U/L (ref 15–41)
Albumin: 3.5 g/dL (ref 3.5–5.0)
Alkaline Phosphatase: 56 U/L (ref 38–126)
Anion gap: 6 (ref 5–15)
BUN: 16 mg/dL (ref 8–23)
CO2: 29 mmol/L (ref 22–32)
Calcium: 8.9 mg/dL (ref 8.9–10.3)
Chloride: 102 mmol/L (ref 98–111)
Creatinine, Ser: 0.51 mg/dL (ref 0.44–1.00)
GFR, Estimated: >60 mL/min (ref >60)
Glucose, Bld: 127 mg/dL — ABNORMAL HIGH (ref 70–99)
Potassium: 3.4 mmol/L — ABNORMAL LOW (ref 3.5–5.1)
Sodium: 137 mmol/L (ref 135–145)
Total Bilirubin: 0.8 mg/dL (ref 0.0–1.2)
Total Protein: 6.1 g/dL — ABNORMAL LOW (ref 6.5–8.1)

## 2024-02-02 LAB — TROPONIN I (HIGH SENSITIVITY)
Troponin I (High Sensitivity): 4 ng/L (ref <18)
Troponin I (High Sensitivity): 5 ng/L (ref <18)

## 2024-02-02 LAB — RESP PANEL BY RT-PCR (RSV, FLU A&B, COVID)  RVPGX2
Influenza A by PCR: NEGATIVE
Influenza B by PCR: NEGATIVE
Resp Syncytial Virus by PCR: NEGATIVE
SARS Coronavirus 2 by RT PCR: NEGATIVE

## 2024-02-02 LAB — MAGNESIUM: Magnesium: 2 mg/dL (ref 1.7–2.4)

## 2024-02-02 MED ORDER — IPRATROPIUM-ALBUTEROL 0.5-2.5 (3) MG/3ML IN SOLN
RESPIRATORY_TRACT | Status: AC
  Administered 2024-02-02: 3 mL via RESPIRATORY_TRACT
  Filled 2024-02-02: qty 3

## 2024-02-02 MED ORDER — METHYLPREDNISOLONE SODIUM SUCC 125 MG IJ SOLR
125.0000 mg | Freq: Once | INTRAMUSCULAR | Status: AC
  Administered 2024-02-02: 125 mg via INTRAVENOUS
  Filled 2024-02-02: qty 2

## 2024-02-02 MED ORDER — IPRATROPIUM-ALBUTEROL 0.5-2.5 (3) MG/3ML IN SOLN
3.0000 mL | Freq: Once | RESPIRATORY_TRACT | Status: AC
  Filled 2024-02-02: qty 3

## 2024-02-02 MED ORDER — IPRATROPIUM-ALBUTEROL 0.5-2.5 (3) MG/3ML IN SOLN
6.0000 mL | Freq: Once | RESPIRATORY_TRACT | Status: AC
  Administered 2024-02-02: 6 mL via RESPIRATORY_TRACT
  Filled 2024-02-02: qty 3

## 2024-02-02 MED ORDER — PREDNISONE 10 MG (21) PO TBPK: ORAL_TABLET | ORAL | 0 refills | Status: AC

## 2024-02-02 NOTE — ED Provider Notes (Signed)
 [facility] Provider Note    Event Date/Time   First MD Initiated Contact with Patient 02/02/24 2012     (approximate)   History   Shortness of Breath (2 weeks)   HPI  Renee Cochran is a 63 y.o. female who presents to the ED for evaluation of Shortness of Breath (2 weeks)   I review a PCP visit from December.  History of COPD on 2 L chronically at home.  Otherwise history of HTN  Reports he just finished a round of 5 days of both steroids and Levaquin at the direction of her PCP yesterday, but despite this worsening shortness of breath and intermittent tachycardia at home today.  EMS reports an axillary temp of 99 degrees and a code sepsis protocols were activated.  She received a gram of IV Tylenol prehospital.   Physical Exam   Triage Vital Signs: ED Triage Vitals  Encounter Vitals Group     BP      Systolic BP Percentile      Diastolic BP Percentile      Pulse      Resp      Temp      Temp src      SpO2      Weight      Height      Head Circumference      Peak Flow      Pain Score      Pain Loc      Pain Education      Exclude from Growth Chart     Most recent vital signs: Vitals:   02/02/24 2018 02/02/24 2336  BP: (!) 143/82 130/88  Pulse: (!) 113 100  Resp:  20  Temp: 98.7 F (37.1 C)   SpO2: 97% 90%    General: Awake, no distress.  Pursed lip breathing, but able to answer questions and speak in mostly full sentences CV:  Good peripheral perfusion.  Resp:  Tachypnea, poor airflow and with wheezing throughout. Abd:  No distention.  MSK:  No deformity noted.  Neuro:  No focal deficits appreciated. Other:     ED Results / Procedures / Treatments   Labs (all labs ordered are listed, but only abnormal results are displayed) Labs Reviewed  COMPREHENSIVE METABOLIC PANEL WITH GFR - Abnormal; Notable for the following components:      Result Value   Potassium 3.4 (*)    Glucose, Bld 127 (*)    Total Protein 6.1 (*)     All other components within normal limits  CBC WITH DIFFERENTIAL/PLATELET - Abnormal; Notable for the following components:   WBC 15.6 (*)    Neutro Abs 12.9 (*)    Monocytes Absolute 1.4 (*)    Abs Immature Granulocytes 0.12 (*)    All other components within normal limits  RESP PANEL BY RT-PCR (RSV, FLU A&B, COVID)  [id number]  MAGNESIUM  TROPONIN I (HIGH SENSITIVITY)  TROPONIN I (HIGH SENSITIVITY)    EKG Sinus tachycardia with a rate of 1 2 bpm.  Normal axis and intervals.  No acute signs of acute ischemia.  RADIOLOGY CXR interpreted by me without evidence of acute cardiopulmonary pathology.  Official radiology report(s): DG Chest Portable 1 View Result Date: 02/02/2024 CLINICAL DATA:  COPD exacerbation EXAM: PORTABLE CHEST 1 VIEW COMPARISON:  05/05/2023 FINDINGS: Emphysema. Bandlike scarring or atelectasis at the bases. No acute airspace disease, pleural effusion, or pneumothorax. Stable cardiomediastinal silhouette IMPRESSION: No active disease. Emphysema. Electronically Signed  By: Jasmine Pang M.D.   On: 02/02/2024 21:00    PROCEDURES and INTERVENTIONS:  .1-3 Lead EKG Interpretation  Performed by: Delton Prairie, MD Authorized by: Delton Prairie, MD     Interpretation: normal     ECG rate:  96   ECG rate assessment: normal     Rhythm: sinus rhythm     Ectopy: none     Conduction: normal     Medications  ipratropium-albuterol (DUONEB) 0.5-2.5 (3) MG/3ML nebulizer solution 6 mL (6 mLs Nebulization Given 02/02/24 2028)  methylPREDNISolone sodium succinate (SOLU-MEDROL) 125 mg/2 mL injection 125 mg (125 mg Intravenous Given 02/02/24 2028)  ipratropium-albuterol (DUONEB) 0.5-2.5 (3) MG/3ML nebulizer solution 3 mL (3 mLs Nebulization Given 02/02/24 2144)     IMPRESSION / MDM / ASSESSMENT AND PLAN / ED COURSE  I reviewed the triage vital signs and the nursing notes.  Differential diagnosis includes, but is not limited to, ACS, PTX, PNA, muscle strain/spasm, PE, dissection,  anxiety, pleural effusion  {Patient presents with symptoms of an acute illness or injury that is potentially life-threatening.  Patient presents with evidence of a COPD exacerbation suitable for outpatient management with a steroid taper.  No infiltrates or indications for another round of antibiotics.  Her leukocytosis is likely due to her recent steroid burst.  Negative viral swabs, troponins and reassuring metabolic panel.  Clinical Course as of 02/02/24 2338  Thu Feb 02, 2024  2128 Reassessed and reexamined.  Looks better.  Heart rate and respiratory rate have normalized.  Significantly improved airflow after breathing treatment.  Some mild residual wheezes.  Will provide another round of breathing treatments.  Discussed plan of care, she is hopeful to go home. [DS]  2331 Reassessed.  Feeling better and requesting discharge.  We discussed what admission would look like and she does not want to do this, I think this is reasonable she is improved dramatically.  We discussed steroid taper and return precautions.  She will call her daughter who will come pick her up [DS]    Clinical Course User Index [DS] Delton Prairie, MD     FINAL CLINICAL IMPRESSION(S) / ED DIAGNOSES   Final diagnoses:  COPD exacerbation (HCC)  Shortness of breath     Rx / DC Orders   ED Discharge Orders          Ordered    predniSONE (STERAPRED UNI-PAK 21 TAB) 10 MG (21) TBPK tablet  Daily        02/02/24 2332             Note:  This document was prepared using Dragon voice recognition software and may include unintentional dictation errors.   Delton Prairie, MD 02/02/24 [phone]

## 2024-02-02 NOTE — ED Notes (Signed)
 Pt reporting to ED d/t SOB from COPD exacerbation with unknown cause for the exacerbation. Pt wears O2 at home, 2 lpm via [place] and is currently at 2 lpm. Pt breathing mildly labored, but pt reports just using the restroom. Pt has had blood work and imaging done and is awaiting plan. Pt ABCs intact. RR even and CE symmetrical. Pt in NAD. Bed in lowest locked position. Call bell in reach. Denies needs at this time.    Past Medical History:  Diagnosis Date   Asthma    Collapse of left lung    COPD (chronic obstructive pulmonary disease) (HCC)

## 2024-02-03 NOTE — ED Notes (Signed)
 Pt discharged to ED circle at this time and left with all belongings. Pt ABCs intact. RR even and unlabored. Pt in NAD. Pt denies further needs from this RN.
